# Patient Record
Sex: Female | Born: 1990 | Race: White | Hispanic: No | Marital: Married | Smoking: Never smoker
Health system: Southern US, Community
[De-identification: ages and names within clinical notes are randomized; demographics above are authoritative.]

## PROBLEM LIST (undated history)

## (undated) ENCOUNTER — Inpatient Hospital Stay (HOSPITAL_COMMUNITY): Payer: Self-pay

## (undated) DIAGNOSIS — B9681 Helicobacter pylori [H. pylori] as the cause of diseases classified elsewhere: Secondary | ICD-10-CM

## (undated) DIAGNOSIS — K279 Peptic ulcer, site unspecified, unspecified as acute or chronic, without hemorrhage or perforation: Secondary | ICD-10-CM

## (undated) DIAGNOSIS — N83209 Unspecified ovarian cyst, unspecified side: Secondary | ICD-10-CM

## (undated) DIAGNOSIS — E282 Polycystic ovarian syndrome: Secondary | ICD-10-CM

## (undated) HISTORY — PX: NO PAST SURGERIES: SHX2092

## (undated) HISTORY — DX: Helicobacter pylori (H. pylori) as the cause of diseases classified elsewhere: K27.9

## (undated) HISTORY — PX: UPPER GI ENDOSCOPY: SHX6162

## (undated) HISTORY — DX: Helicobacter pylori (H. pylori) as the cause of diseases classified elsewhere: B96.81

---

## 2016-07-31 ENCOUNTER — Other Ambulatory Visit (HOSPITAL_COMMUNITY)
Admission: RE | Admit: 2016-07-31 | Discharge: 2016-07-31 | Disposition: A | Discharge status: Home / Self Care | Payer: 59 | Source: Ambulatory Visit | Attending: Obstetrics & Gynecology | Admitting: Obstetrics & Gynecology

## 2016-07-31 ENCOUNTER — Other Ambulatory Visit: Payer: Self-pay | Admitting: Obstetrics & Gynecology

## 2016-07-31 DIAGNOSIS — Z01419 Encounter for gynecological examination (general) (routine) without abnormal findings: Secondary | ICD-10-CM | POA: Insufficient documentation

## 2016-08-02 LAB — CYTOLOGY - PAP: Diagnosis: NEGATIVE

## 2016-09-07 ENCOUNTER — Other Ambulatory Visit: Payer: Self-pay | Admitting: Gastroenterology

## 2016-09-07 DIAGNOSIS — R1033 Periumbilical pain: Secondary | ICD-10-CM

## 2016-09-07 DIAGNOSIS — R110 Nausea: Secondary | ICD-10-CM

## 2016-09-18 ENCOUNTER — Ambulatory Visit (HOSPITAL_COMMUNITY)
Admission: RE | Admit: 2016-09-18 | Discharge: 2016-09-18 | Disposition: A | Discharge status: Home / Self Care | Payer: 59 | Source: Ambulatory Visit | Attending: Gastroenterology | Admitting: Gastroenterology

## 2016-09-18 ENCOUNTER — Encounter (HOSPITAL_COMMUNITY): Payer: Self-pay | Admitting: Radiology

## 2016-09-18 DIAGNOSIS — R1033 Periumbilical pain: Secondary | ICD-10-CM

## 2016-09-18 DIAGNOSIS — R110 Nausea: Secondary | ICD-10-CM | POA: Diagnosis present

## 2016-09-18 MED ORDER — TECHNETIUM TC 99M MEBROFENIN IV KIT
4.9400 | PACK | Freq: Once | INTRAVENOUS | Status: AC | PRN
Start: 2016-09-18 — End: 2016-09-18
  Administered 2016-09-18: 4.94 via INTRAVENOUS

## 2017-10-09 NOTE — L&D Delivery Note (Signed)
Delivery Note At 2:51 PM a viable and healthy female was delivered via Vaginal, Spontaneous (Presentation: LOA  ).  APGAR: 8, 9; weight  pending.   Placenta status: spontaneous, intact.  Cord:  with the following complications: none.  Cord pH: na  Anesthesia:  epidural Episiotomy:  none Lacerations: 3rd degree- 100% int/anal sphincter Suture Repair: 2.0 vicryl rapide and 0 vicryl Est. Blood Loss (mL):  327  Mom to postpartum.  Baby to Couplet care / Skin to Skin.  Redmond Whittley J 07/08/2018, 3:12 PM

## 2017-11-26 ENCOUNTER — Other Ambulatory Visit: Payer: Self-pay

## 2017-11-26 ENCOUNTER — Encounter (HOSPITAL_COMMUNITY): Payer: Self-pay | Admitting: Anesthesiology

## 2017-11-26 ENCOUNTER — Encounter (HOSPITAL_COMMUNITY): Payer: Self-pay | Admitting: *Deleted

## 2017-11-26 ENCOUNTER — Inpatient Hospital Stay (HOSPITAL_COMMUNITY)
Admission: AD | Admit: 2017-11-26 | Discharge: 2017-11-26 | Disposition: A | Discharge status: Home / Self Care | Payer: Commercial Managed Care - PPO | Source: Ambulatory Visit | Attending: Obstetrics & Gynecology | Admitting: Obstetrics & Gynecology

## 2017-11-26 DIAGNOSIS — R1110 Vomiting, unspecified: Secondary | ICD-10-CM | POA: Diagnosis present

## 2017-11-26 DIAGNOSIS — Z3A01 Less than 8 weeks gestation of pregnancy: Secondary | ICD-10-CM | POA: Diagnosis not present

## 2017-11-26 DIAGNOSIS — A084 Viral intestinal infection, unspecified: Secondary | ICD-10-CM | POA: Diagnosis not present

## 2017-11-26 DIAGNOSIS — E282 Polycystic ovarian syndrome: Secondary | ICD-10-CM | POA: Diagnosis not present

## 2017-11-26 DIAGNOSIS — O99611 Diseases of the digestive system complicating pregnancy, first trimester: Secondary | ICD-10-CM | POA: Insufficient documentation

## 2017-11-26 DIAGNOSIS — O26891 Other specified pregnancy related conditions, first trimester: Secondary | ICD-10-CM | POA: Diagnosis not present

## 2017-11-26 DIAGNOSIS — O99281 Endocrine, nutritional and metabolic diseases complicating pregnancy, first trimester: Secondary | ICD-10-CM | POA: Diagnosis not present

## 2017-11-26 DIAGNOSIS — R109 Unspecified abdominal pain: Secondary | ICD-10-CM | POA: Diagnosis not present

## 2017-11-26 HISTORY — DX: Unspecified ovarian cyst, unspecified side: N83.209

## 2017-11-26 HISTORY — DX: Polycystic ovarian syndrome: E28.2

## 2017-11-26 LAB — CBC
HCT: 36.5 % (ref 36.0–46.0)
Hemoglobin: 12.8 g/dL (ref 12.0–15.0)
MCH: 30.5 pg (ref 26.0–34.0)
MCHC: 35.1 g/dL (ref 30.0–36.0)
MCV: 87.1 fL (ref 78.0–100.0)
PLATELETS: 257 10*3/uL (ref 150–400)
RBC: 4.19 MIL/uL (ref 3.87–5.11)
RDW: 11.7 % (ref 11.5–15.5)
WBC: 10.3 10*3/uL (ref 4.0–10.5)

## 2017-11-26 LAB — COMPREHENSIVE METABOLIC PANEL
ALBUMIN: 4.5 g/dL (ref 3.5–5.0)
ALT: 15 U/L (ref 14–54)
ANION GAP: 8 (ref 5–15)
AST: 20 U/L (ref 15–41)
Alkaline Phosphatase: 43 U/L (ref 38–126)
BILIRUBIN TOTAL: 1 mg/dL (ref 0.3–1.2)
BUN: 7 mg/dL (ref 6–20)
CO2: 22 mmol/L (ref 22–32)
Calcium: 9.2 mg/dL (ref 8.9–10.3)
Chloride: 103 mmol/L (ref 101–111)
Creatinine, Ser: 0.55 mg/dL (ref 0.44–1.00)
GFR calc Af Amer: >60 mL/min (ref >60)
GFR calc non Af Amer: >60 mL/min (ref >60)
GLUCOSE: 98 mg/dL (ref 65–99)
POTASSIUM: 4 mmol/L (ref 3.5–5.1)
SODIUM: 133 mmol/L — AB (ref 135–145)
TOTAL PROTEIN: 7.6 g/dL (ref 6.5–8.1)

## 2017-11-26 LAB — URINALYSIS, ROUTINE W REFLEX MICROSCOPIC
BILIRUBIN URINE: NEGATIVE
Glucose, UA: NEGATIVE mg/dL
HGB URINE DIPSTICK: NEGATIVE
Ketones, ur: NEGATIVE mg/dL
LEUKOCYTES UA: NEGATIVE
NITRITE: NEGATIVE
PH: 8 (ref 5.0–8.0)
Protein, ur: 30 mg/dL — AB
SPECIFIC GRAVITY, URINE: 1.019 (ref 1.005–1.030)

## 2017-11-26 LAB — POCT PREGNANCY, URINE: PREG TEST UR: POSITIVE — AB

## 2017-11-26 MED ORDER — M.V.I. ADULT IV INJ
Freq: Once | INTRAVENOUS | Status: AC
  Administered 2017-11-26: 12:00:00 via INTRAVENOUS
  Filled 2017-11-26: qty 10

## 2017-11-26 MED ORDER — PROMETHAZINE HCL 25 MG/ML IJ SOLN
25.0000 mg | Freq: Once | INTRAMUSCULAR | Status: AC
  Administered 2017-11-26: 25 mg via INTRAVENOUS
  Filled 2017-11-26: qty 1

## 2017-11-26 MED ORDER — ONDANSETRON 8 MG PO TBDP: 8.0000 mg | ORAL_TABLET | Freq: Three times a day (TID) | ORAL | 0 refills | Status: DC | PRN

## 2017-11-26 MED ORDER — LACTATED RINGERS IV BOLUS (SEPSIS)
1000.0000 mL | Freq: Once | INTRAVENOUS | Status: AC
  Administered 2017-11-26: 1000 mL via INTRAVENOUS

## 2017-11-26 MED ORDER — SODIUM CHLORIDE 0.9 % IV SOLN
8.0000 mg | Freq: Once | INTRAVENOUS | Status: AC
  Administered 2017-11-26: 8 mg via INTRAVENOUS
  Filled 2017-11-26: qty 4

## 2017-11-26 NOTE — MAU Note (Signed)
Ongoing vomiting, ? Dehydration.  Had US confirmation in office last wk.

## 2017-11-26 NOTE — Discharge Instructions (Signed)
Bland Diet A bland diet consists of foods that do not have a lot of fat or fiber. Foods without fat or fiber are easier for the body to digest. They are also less likely to irritate your mouth, throat, stomach, and other parts of your gastrointestinal tract. A bland diet is sometimes called a BRAT diet. What is my plan? Your health care provider or dietitian may recommend specific changes to your diet to prevent and treat your symptoms, such as:  Eating small meals often.  Cooking food until it is soft enough to chew easily.  Chewing your food well.  Drinking fluids slowly.  Not eating foods that are very spicy, sour, or fatty.  Not eating citrus fruits, such as oranges and grapefruit.  What do I need to know about this diet?  Eat a variety of foods from the bland diet food list.  Do not follow a bland diet longer than you have to.  Ask your health care provider whether you should take vitamins. What foods can I eat? Grains  Hot cereals, such as cream of wheat. Bread, crackers, or tortillas made from refined white flour. Rice. Vegetables Canned or cooked vegetables. Mashed or boiled potatoes. Fruits Bananas. Applesauce. Other types of cooked or canned fruit with the skin and seeds removed, such as canned peaches or pears. Meats and Other Protein Sources Scrambled eggs. Creamy peanut butter or other nut butters. Lean, well-cooked meats, such as chicken or fish. Tofu. Soups or broths. Dairy Low-fat dairy products, such as milk, cottage cheese, or yogurt. Beverages Water. Herbal tea. Apple juice. Sweets and Desserts Pudding. Custard. Fruit gelatin. Ice cream. Fats and Oils Mild salad dressings. Canola or olive oil. The items listed above may not be a complete list of allowed foods or beverages. Contact your dietitian for more options. What foods are not recommended? Foods and ingredients that are often not recommended include:  Spicy foods, such as hot sauce or  salsa.  Fried foods.  Sour foods, such as pickled or fermented foods.  Raw vegetables or fruits, especially citrus or berries.  Caffeinated drinks.  Alcohol.  Strongly flavored seasonings or condiments.  The items listed above may not be a complete list of foods and beverages that are not allowed. Contact your dietitian for more information. This information is not intended to replace advice given to you by your health care provider. Make sure you discuss any questions you have with your health care provider. Document Released: 01/17/2016 Document Revised: 03/02/2016 Document Reviewed: 10/07/2014 Elsevier Interactive Patient Education  2018 Elsevier Inc. Viral Gastroenteritis, Adult Viral gastroenteritis is also known as the stomach flu. This condition is caused by certain germs (viruses). These germs can be passed from person to person very easily (are very contagious). This condition can cause sudden watery poop (diarrhea), fever, and throwing up (vomiting). Having watery poop and throwing up can make you feel weak and cause you to get dehydrated. Dehydration can make you tired and thirsty, make you have a dry mouth, and make it so you pee (urinate) less often. Older adults and people with other diseases or a weak defense system (immune system) are at higher risk for dehydration. It is important to replace the fluids that you lose from having watery poop and throwing up. Follow these instructions at home: Follow instructions from your doctor about how to care for yourself at home. Eating and drinking  Follow these instructions as told by your doctor:  Take an oral rehydration solution (ORS). This is   a drink that is sold at pharmacies and stores.  Drink clear fluids in small amounts as you are able, such as: ? Water. ? Ice chips. ? Diluted fruit juice. ? Low-calorie sports drinks.  Eat bland, easy-to-digest foods in small amounts as you are able, such  as: ? Bananas. ? Applesauce. ? Rice. ? Low-fat (lean) meats. ? Toast. ? Crackers.  Avoid fluids that have a lot of sugar or caffeine in them.  Avoid alcohol.  Avoid spicy or fatty foods.  General instructions  Drink enough fluid to keep your pee (urine) clear or pale yellow.  Wash your hands often. If you cannot use soap and water, use hand sanitizer.  Make sure that all people in your home wash their hands well and often.  Rest at home while you get better.  Take over-the-counter and prescription medicines only as told by your doctor.  Watch your condition for any changes.  Take a warm bath to help with any burning or pain from having watery poop.  Keep all follow-up visits as told by your doctor. This is important. Contact a doctor if:  You cannot keep fluids down.  Your symptoms get worse.  You have new symptoms.  You feel light-headed or dizzy.  You have muscle cramps. Get help right away if:  You have chest pain.  You feel very weak or you pass out (faint).  You see blood in your throw-up.  Your throw-up looks like coffee grounds.  You have bloody or black poop (stools) or poop that look like tar.  You have a very bad headache, a stiff neck, or both.  You have a rash.  You have very bad pain, cramping, or bloating in your belly (abdomen).  You have trouble breathing.  You are breathing very quickly.  Your heart is beating very quickly.  Your skin feels cold and clammy.  You feel confused.  You have pain when you pee.  You have signs of dehydration, such as: ? Dark pee, hardly any pee, or no pee. ? Cracked lips. ? Dry mouth. ? Sunken eyes. ? Sleepiness. ? Weakness. This information is not intended to replace advice given to you by your health care provider. Make sure you discuss any questions you have with your health care provider. Document Released: 03/13/2008 Document Revised: 04/14/2016 Document Reviewed: 06/01/2015 Elsevier  Interactive Patient Education  2017 Elsevier Inc.  

## 2017-11-26 NOTE — MAU Note (Signed)
CRNA called to start IV 

## 2017-11-26 NOTE — MAU Provider Note (Signed)
History     CSN: [id number]  Arrival date and time: 11/26/17 [id number]   First Provider Initiated Contact with Patient 11/26/17 0901      Chief Complaint  Patient presents with  . Emesis  . dehydrated  . Abdominal Pain   G1 @[redacted]w[redacted]d  here with N/V. Sx started 2 days ago. She is unable to tolerate anything po. No sick contacts. Associated sx are diarrhea x2 episodes. No fevers. No VB. Some abdominal cramping. Pt seen in office last week and had confirmed IUP on US.    OB History    Gravida Para Term Preterm AB Living   1             SAB TAB Ectopic Multiple Live Births                  Past Medical History:  Diagnosis Date  . Ovarian cyst   . PCOS (polycystic ovarian syndrome)     Past Surgical History:  Procedure Laterality Date  . NO PAST SURGERIES    . UPPER GI ENDOSCOPY      Family History  Problem Relation Age of Onset  . Heart disease Maternal Grandmother   . Stroke Paternal Grandfather   . Heart disease Paternal Grandfather     Social History   Tobacco Use  . Smoking status: Never Smoker  . Smokeless tobacco: Never Used  Substance Use Topics  . Alcohol use: Yes    Comment: not with preg  . Drug use: No    Allergies: No Known Allergies  Medications Prior to Admission  Medication Sig Dispense Refill Last Dose  . doxylamine, Sleep, (UNISOM) 25 MG tablet Take 25 mg by mouth at bedtime as needed.   11/25/2017 at Unknown time  . Prenatal Vit-Fe Fumarate-FA (PRENATAL MULTIVITAMIN) TABS tablet Take 1 tablet by mouth daily at 12 noon.   11/25/2017 at Unknown time  . pyridOXINE (VITAMIN B-6) 25 MG tablet Take 25 mg by mouth daily.   11/25/2017 at Unknown time    Review of Systems  Constitutional: Negative for fever.  Gastrointestinal: Positive for abdominal pain, diarrhea, nausea and vomiting. Negative for constipation.  Genitourinary: Negative for vaginal bleeding.   Physical Exam   Blood pressure (!) 118/56, pulse 74, temperature 98.5 F (36.9 C),  temperature source Oral, resp. rate 18, height 5' 3.39" (1.61 m), weight 141 lb 1.9 oz (64 kg), SpO2 100 %.  Physical Exam  Nursing note and vitals reviewed. Constitutional: She is oriented to person, place, and time. She appears well-developed and well-nourished. No distress.  HENT:  Head: Normocephalic.  Neck: Normal range of motion.  Respiratory: Effort normal. No respiratory distress.  Musculoskeletal: Normal range of motion.  Neurological: She is alert and oriented to person, place, and time.  Skin: Skin is warm and dry.  Psychiatric: She has a normal mood and affect.   Results for orders placed or performed during the hospital encounter of 11/26/17 (from the past 24 hour(s))  Urinalysis, Routine w reflex microscopic     Status: Abnormal   Collection Time: 11/26/17  8:27 AM  Result Value Ref Range   Color, Urine YELLOW YELLOW   APPearance HAZY (A) CLEAR   Specific Gravity, Urine 1.019 1.005 - 1.030   pH 8.0 5.0 - 8.0   Glucose, UA NEGATIVE NEGATIVE mg/dL   Hgb urine dipstick NEGATIVE NEGATIVE   Bilirubin Urine NEGATIVE NEGATIVE   Ketones, ur NEGATIVE NEGATIVE mg/dL   Protein, ur 30 (A) NEGATIVE mg/dL  Nitrite NEGATIVE NEGATIVE   Leukocytes, UA NEGATIVE NEGATIVE   RBC / HPF 0-5 0 - 5 RBC/hpf   WBC, UA 0-5 0 - 5 WBC/hpf   Bacteria, UA RARE (A) NONE SEEN   Squamous Epithelial / LPF 6-30 (A) NONE SEEN   Mucus PRESENT   Pregnancy, urine POC     Status: Abnormal   Collection Time: 11/26/17  8:37 AM  Result Value Ref Range   Preg Test, Ur POSITIVE (A) NEGATIVE  CBC     Status: None   Collection Time: 11/26/17  9:17 AM  Result Value Ref Range   WBC 10.3 4.0 - 10.5 K/uL   RBC 4.19 3.87 - 5.11 MIL/uL   Hemoglobin 12.8 12.0 - 15.0 g/dL   HCT 91.4 78.2 - 95.6 %   MCV 87.1 78.0 - 100.0 fL   MCH 30.5 26.0 - 34.0 pg   MCHC 35.1 30.0 - 36.0 g/dL   RDW 21.3 08.6 - 57.8 %   Platelets 257 150 - 400 K/uL  Comprehensive metabolic panel     Status: Abnormal   Collection Time:  11/26/17  9:17 AM  Result Value Ref Range   Sodium 133 (L) 135 - 145 mmol/L   Potassium 4.0 3.5 - 5.1 mmol/L   Chloride 103 101 - 111 mmol/L   CO2 22 22 - 32 mmol/L   Glucose, Bld 98 65 - 99 mg/dL   BUN 7 6 - 20 mg/dL   Creatinine, Ser 4.69 0.44 - 1.00 mg/dL   Calcium 9.2 8.9 - 62.9 mg/dL   Total Protein 7.6 6.5 - 8.1 g/dL   Albumin 4.5 3.5 - 5.0 g/dL   AST 20 15 - 41 U/L   ALT 15 14 - 54 U/L   Alkaline Phosphatase 43 38 - 126 U/L   Total Bilirubin 1.0 0.3 - 1.2 mg/dL   GFR calc non Af Amer >60 >60 mL/min   GFR calc Af Amer >60 >60 mL/min   Anion gap 8 5 - 15   Limited bedside US: viable, active fetus, +cardiac activity, subj. nml AFV  MAU Course  Procedures LR 1 L Phenergan MTV 1 L Zofran  MDM Labs ordered and reviewed. Had some emesis after Phenergan. Zofran ordered. Pt reports nausea improved, no further emesis, tolerating po fluids. Presentation, clinical findings, and plan discussed with Dr. Richardson Dopp. Stable for discharge home.  Assessment and Plan   1. [redacted] weeks gestation of pregnancy   2. Viral gastroenteritis    Discharge home Follow up in OB office in 2 weeks Rx Zofran ODT BRAT diet Return for worsening sx  Allergies as of 11/26/2017   No Known Allergies     Medication List    TAKE these medications   doxylamine (Sleep) 25 MG tablet Commonly known as:  UNISOM Take 25 mg by mouth at bedtime as needed.   ondansetron 8 MG disintegrating tablet Commonly known as:  ZOFRAN ODT Take 1 tablet (8 mg total) by mouth every 8 (eight) hours as needed for nausea or vomiting.   prenatal multivitamin Tabs tablet Take 1 tablet by mouth daily at 12 noon.   pyridOXINE 25 MG tablet Commonly known as:  VITAMIN B-6 Take 25 mg by mouth daily.      Donette Larry, CNM 11/26/2017, 2:56 PM

## 2017-11-26 NOTE — MAU Note (Signed)
RN to the BS to start IV, unsuccessful x2.  Another RN called to the BS to start IV.

## 2017-11-26 NOTE — MAU Note (Signed)
Ongoing vomiting, taking unisom and B6, "not touching it"

## 2017-12-17 ENCOUNTER — Inpatient Hospital Stay (HOSPITAL_COMMUNITY)
Admission: AD | Admit: 2017-12-17 | Discharge: 2017-12-17 | Disposition: A | Discharge status: Home / Self Care | Payer: Commercial Managed Care - PPO | Source: Ambulatory Visit | Attending: Obstetrics & Gynecology | Admitting: Obstetrics & Gynecology

## 2017-12-17 ENCOUNTER — Encounter (HOSPITAL_COMMUNITY): Payer: Self-pay | Admitting: *Deleted

## 2017-12-17 DIAGNOSIS — E282 Polycystic ovarian syndrome: Secondary | ICD-10-CM | POA: Diagnosis not present

## 2017-12-17 DIAGNOSIS — Z3A10 10 weeks gestation of pregnancy: Secondary | ICD-10-CM | POA: Diagnosis not present

## 2017-12-17 DIAGNOSIS — R112 Nausea with vomiting, unspecified: Secondary | ICD-10-CM | POA: Diagnosis present

## 2017-12-17 DIAGNOSIS — O3481 Maternal care for other abnormalities of pelvic organs, first trimester: Secondary | ICD-10-CM | POA: Insufficient documentation

## 2017-12-17 DIAGNOSIS — O219 Vomiting of pregnancy, unspecified: Secondary | ICD-10-CM | POA: Insufficient documentation

## 2017-12-17 LAB — URINALYSIS, ROUTINE W REFLEX MICROSCOPIC
Bilirubin Urine: NEGATIVE
Glucose, UA: NEGATIVE mg/dL
Hgb urine dipstick: NEGATIVE
KETONES UR: 80 mg/dL — AB
LEUKOCYTES UA: NEGATIVE
Nitrite: NEGATIVE
PH: 6 (ref 5.0–8.0)
Protein, ur: NEGATIVE mg/dL
Specific Gravity, Urine: 1.025 (ref 1.005–1.030)

## 2017-12-17 MED ORDER — FAMOTIDINE IN NACL 20-0.9 MG/50ML-% IV SOLN
20.0000 mg | Freq: Once | INTRAVENOUS | Status: AC
  Administered 2017-12-17: 20 mg via INTRAVENOUS
  Filled 2017-12-17: qty 50

## 2017-12-17 MED ORDER — METOCLOPRAMIDE HCL 10 MG PO TABS: 10.0000 mg | ORAL_TABLET | Freq: Three times a day (TID) | ORAL | 1 refills | Status: DC

## 2017-12-17 MED ORDER — SODIUM CHLORIDE 0.9 % IV SOLN
8.0000 mg | Freq: Once | INTRAVENOUS | Status: AC
  Administered 2017-12-17: 8 mg via INTRAVENOUS
  Filled 2017-12-17: qty 4

## 2017-12-17 MED ORDER — METOCLOPRAMIDE HCL 5 MG/ML IJ SOLN
10.0000 mg | Freq: Once | INTRAMUSCULAR | Status: AC
  Administered 2017-12-17: 10 mg via INTRAVENOUS
  Filled 2017-12-17: qty 2

## 2017-12-17 MED ORDER — LACTATED RINGERS IV BOLUS (SEPSIS)
1000.0000 mL | Freq: Once | INTRAVENOUS | Status: AC
  Administered 2017-12-17: 1000 mL via INTRAVENOUS

## 2017-12-17 MED ORDER — DEXTROSE 5 % IN LACTATED RINGERS IV BOLUS
1000.0000 mL | Freq: Once | INTRAVENOUS | Status: AC
  Administered 2017-12-17: 1000 mL via INTRAVENOUS

## 2017-12-17 NOTE — Discharge Instructions (Signed)
Nausea and Vomiting, Adult Feeling sick to your stomach (nausea) means that your stomach is upset or you feel like you have to throw up (vomit). Feeling more and more sick to your stomach can lead to throwing up. Throwing up happens when food and liquid from your stomach are thrown up and out the mouth. Throwing up can make you feel weak and cause you to get dehydrated. Dehydration can make you tired and thirsty, make you have a dry mouth, and make it so you pee (urinate) less often. Older adults and people with other diseases or a weak defense system (immune system) are at higher risk for dehydration. If you feel sick to your stomach or if you throw up, it is important to follow instructions from your doctor about how to take care of yourself. Follow these instructions at home: Eating and drinking Follow these instructions as told by your doctor:  Take an oral rehydration solution (ORS). This is a drink that is sold at pharmacies and stores.  Drink clear fluids in small amounts as you are able, such as: ? Water. ? Ice chips. ? Diluted fruit juice. ? Low-calorie sports drinks.  Eat bland, easy-to-digest foods in small amounts as you are able, such as: ? Bananas. ? Applesauce. ? Rice. ? Low-fat (lean) meats. ? Toast. ? Crackers.  Avoid fluids that have a lot of sugar or caffeine in them.  Avoid alcohol.  Avoid spicy or fatty foods.  General instructions  Drink enough fluid to keep your pee (urine) clear or pale yellow.  Wash your hands often. If you cannot use soap and water, use hand sanitizer.  Make sure that all people in your home wash their hands well and often.  Take over-the-counter and prescription medicines only as told by your doctor.  Rest at home while you get better.  Watch your condition for any changes.  Breathe slowly and deeply when you feel sick to your stomach.  Keep all follow-up visits as told by your doctor. This is important. Contact a doctor  if:  You have a fever.  You cannot keep fluids down.  Your symptoms get worse.  You have new symptoms.  You feel sick to your stomach for more than two days.  You feel light-headed or dizzy.  You have a headache.  You have muscle cramps. Get help right away if:  You have pain in your chest, neck, arm, or jaw.  You feel very weak or you pass out (faint).  You throw up again and again.  You see blood in your throw-up.  Your throw-up looks like black coffee grounds.  You have bloody or black poop (stools) or poop that look like tar.  You have a very bad headache, a stiff neck, or both.  You have a rash.  You have very bad pain, cramping, or bloating in your belly (abdomen).  You have trouble breathing.  You are breathing very quickly.  Your heart is beating very quickly.  Your skin feels cold and clammy.  You feel confused.  You have pain when you pee.  You have signs of dehydration, such as: ? Dark pee, hardly any pee, or no pee. ? Cracked lips. ? Dry mouth. ? Sunken eyes. ? Sleepiness. ? Weakness. These symptoms may be an emergency. Do not wait to see if the symptoms will go away. Get medical help right away. Call your local emergency services (911 in the [place] Do not drive yourself to the hospital. This information is   not intended to replace advice given to you by your health care provider. Make sure you discuss any questions you have with your health care provider. Document Released: 03/13/2008 Document Revised: 04/14/2016 Document Reviewed: 06/01/2015 Elsevier Interactive Patient Education  2018 Elsevier Inc.  

## 2017-12-17 NOTE — MAU Provider Note (Signed)
History     CSN: [id number]  Arrival date and time: 12/17/17 1626   First Provider Initiated Contact with Patient 12/17/17 1952      Chief Complaint  Patient presents with  . Emesis  . Nausea   HPI   [name] is a 27 y.o. female G1P0 @ [redacted]w[redacted]d here in MAU with nausea and vomiting. Says she has had N/V throughout the pregnancy and has been taking Zofran. Today she was not able to keep anything down. She feels dehydrated. Denies abdominal pain or bleeding.   OB History    Gravida Para Term Preterm AB Living   1             SAB TAB Ectopic Multiple Live Births                  Past Medical History:  Diagnosis Date  . Ovarian cyst   . PCOS (polycystic ovarian syndrome)     Past Surgical History:  Procedure Laterality Date  . NO PAST SURGERIES    . UPPER GI ENDOSCOPY      Family History  Problem Relation Age of Onset  . Heart disease Maternal Grandmother   . Stroke Paternal Grandfather   . Heart disease Paternal Grandfather     Social History   Tobacco Use  . Smoking status: Never Smoker  . Smokeless tobacco: Never Used  Substance Use Topics  . Alcohol use: Yes    Comment: not with preg  . Drug use: No    Allergies: No Known Allergies  Medications Prior to Admission  Medication Sig Dispense Refill Last Dose  . doxylamine, Sleep, (UNISOM) 25 MG tablet Take 25 mg by mouth at bedtime as needed.   Past Week at Unknown time  . ondansetron (ZOFRAN ODT) 8 MG disintegrating tablet Take 1 tablet (8 mg total) by mouth every 8 (eight) hours as needed for nausea or vomiting. 20 tablet 0 12/17/2017 at 1400  . Prenatal Vit-Fe Fumarate-FA (PRENATAL MULTIVITAMIN) TABS tablet Take 1 tablet by mouth daily at 12 noon.   12/16/2017 at Unknown time  . ranitidine (ZANTAC) 300 MG tablet Take 300 mg by mouth at bedtime.   12/16/2017 at Unknown time  . pyridOXINE (VITAMIN B-6) 25 MG tablet Take 25 mg by mouth daily.   Unknown at Unknown time   Results for orders placed or  performed during the hospital encounter of 12/17/17 (from the past 48 hour(s))  Urinalysis, Routine w reflex microscopic     Status: Abnormal   Collection Time: 12/17/17  5:10 PM  Result Value Ref Range   Color, Urine YELLOW YELLOW   APPearance HAZY (A) CLEAR   Specific Gravity, Urine 1.025 1.005 - 1.030   pH 6.0 5.0 - 8.0   Glucose, UA NEGATIVE NEGATIVE mg/dL   Hgb urine dipstick NEGATIVE NEGATIVE   Bilirubin Urine NEGATIVE NEGATIVE   Ketones, ur 80 (A) NEGATIVE mg/dL   Protein, ur NEGATIVE NEGATIVE mg/dL   Nitrite NEGATIVE NEGATIVE   Leukocytes, UA NEGATIVE NEGATIVE    Comment: Performed at [facility], [address] [place], [place] [address]   Review of Systems  Constitutional: Negative for fever.  Gastrointestinal: Positive for nausea and vomiting. Negative for abdominal pain.  Genitourinary: Negative for vaginal bleeding and vaginal discharge.   Physical Exam   Blood pressure 116/68, pulse 67, temperature 98.9 F (37.2 C), temperature source Oral, resp. rate 16, height 5\' 3"  (1.6 m), weight 142 lb (64.4 kg), SpO2 100 %.  Physical  Exam  Constitutional: She is oriented to person, place, and time. She appears well-developed and well-nourished. No distress.  HENT:  Head: Normocephalic.  Respiratory: Effort normal.  Musculoskeletal: Normal range of motion.  Neurological: She is alert and oriented to person, place, and time.  Skin: Skin is warm. She is not diaphoretic.  Psychiatric: Her behavior is normal.   MAU Course  Procedures  None  MDM  + fetal heart tones via doppler  Discussed patient with Dr. Dion BodyVarnado; who was aware the patient was coming into MAU.  Urine shows >80 ketones  LR bolus X 1 D5LR bolus X 1 Pepcid 20 mg IV x1 Zofran 8 mg IV X 1 Reglan 10 mg IV X 1 Patient sipping on apple juice at this time.   Assessment and Plan   A:  1. Nausea and vomiting in pregnancy     P:  Discharge home with strict return precautions Follow up with OB as  scheduled Rx: Reglan Continue medications at home as directed  Small, frequent, bland meals  Chase Cassetta, Harolyn RutherfordJennifer I, NP 12/17/2017 8:46 PM

## 2017-12-17 NOTE — MAU Note (Signed)
Pt reports continued nausea and vomiting with pregnancy. Unable to keep anything down for the last few days.

## 2018-01-10 ENCOUNTER — Inpatient Hospital Stay (HOSPITAL_COMMUNITY)
Admission: AD | Admit: 2018-01-10 | Discharge: 2018-01-10 | Disposition: A | Discharge status: Home / Self Care | Payer: Commercial Managed Care - PPO | Source: Ambulatory Visit | Attending: Obstetrics & Gynecology | Admitting: Obstetrics & Gynecology

## 2018-01-10 ENCOUNTER — Encounter (HOSPITAL_COMMUNITY): Payer: Self-pay | Admitting: *Deleted

## 2018-01-10 DIAGNOSIS — O209 Hemorrhage in early pregnancy, unspecified: Secondary | ICD-10-CM | POA: Diagnosis not present

## 2018-01-10 DIAGNOSIS — Z3A14 14 weeks gestation of pregnancy: Secondary | ICD-10-CM | POA: Insufficient documentation

## 2018-01-10 DIAGNOSIS — O4692 Antepartum hemorrhage, unspecified, second trimester: Secondary | ICD-10-CM

## 2018-01-10 NOTE — MAU Provider Note (Signed)
Chief Complaint: Vaginal Bleeding   First Provider Initiated Contact with Patient 01/10/18 2156        SUBJECTIVE HPI: Molly Chase is a 27 y.o. G1P0 at 2561w0d by LMP who presents to maternity admissions reporting bleeding since 9pm   Was like a period at first, now less.  Minimal cramping.. She denies vaginal itching/burning, urinary symptoms, h/a, dizziness, n/v, or fever/chills.    Vaginal Bleeding  The patient's primary symptoms include pelvic pain and vaginal bleeding. The patient's pertinent negatives include no genital itching, genital lesions or genital odor. This is a new problem. The current episode started today. The problem occurs intermittently. The problem has been gradually improving. She is pregnant. Associated symptoms include nausea. Pertinent negatives include no back pain, chills, constipation, diarrhea, dysuria, fever or vomiting. The vaginal discharge was bloody. The vaginal bleeding is typical of menses. She has not been passing clots. She has not been passing tissue. Nothing aggravates the symptoms. She has tried nothing for the symptoms.   RN Note: PT SAYS VAG BLEEDING -  STARTED  AT 915PM.    HAD U/S  - ALL OK.  PNC  WITH  DR Molly NewtonZAN.   IN TRIAGE-   PUT PAD ON IN B-ROOM-   NOTHING.    LAST SEX- 2 WEEKS AGO .   SAYS 2 WEEKS AGO WAS ON PELVIC  REST AFTER AN EPISODE OF BLEEDING.     MILD  CRAMPS STARTED ON WAY TO HOSPITAL    Past Medical History:  Diagnosis Date  . Ovarian cyst   . PCOS (polycystic ovarian syndrome)    Past Surgical History:  Procedure Laterality Date  . NO PAST SURGERIES    . UPPER GI ENDOSCOPY     Social History   Socioeconomic History  . Marital status: Married    Spouse name: Not on file  . Number of children: Not on file  . Years of education: Not on file  . Highest education level: Not on file  Occupational History  . Not on file  Social Needs  . Financial resource strain: Not on file  . Food insecurity:    Worry: Not on file   Inability: Not on file  . Transportation needs:    Medical: Not on file    Non-medical: Not on file  Tobacco Use  . Smoking status: Never Smoker  . Smokeless tobacco: Never Used  Substance and Sexual Activity  . Alcohol use: Yes    Comment: not with preg  . Drug use: No  . Sexual activity: Not on file  Lifestyle  . Physical activity:    Days per week: Not on file    Minutes per session: Not on file  . Stress: Not on file  Relationships  . Social connections:    Talks on phone: Not on file    Gets together: Not on file    Attends religious service: Not on file    Active member of club or organization: Not on file    Attends meetings of clubs or organizations: Not on file    Relationship status: Not on file  . Intimate partner violence:    Fear of current or ex partner: Not on file    Emotionally abused: Not on file    Physically abused: Not on file    Forced sexual activity: Not on file  Other Topics Concern  . Not on file  Social History Narrative  . Not on file   No current facility-administered medications on  file prior to encounter.    Current Outpatient Medications on File Prior to Encounter  Medication Sig Dispense Refill  . doxylamine, Sleep, (UNISOM) 25 MG tablet Take 25 mg by mouth at bedtime as needed.    . metoCLOPramide (REGLAN) 10 MG tablet Take 1 tablet (10 mg total) by mouth 3 (three) times daily with meals for 180 doses. 90 tablet 1  . ondansetron (ZOFRAN ODT) 8 MG disintegrating tablet Take 1 tablet (8 mg total) by mouth every 8 (eight) hours as needed for nausea or vomiting. 20 tablet 0  . Prenatal Vit-Fe Fumarate-FA (PRENATAL MULTIVITAMIN) TABS tablet Take 1 tablet by mouth daily at 12 noon.    . pyridOXINE (VITAMIN B-6) 25 MG tablet Take 25 mg by mouth daily.    . ranitidine (ZANTAC) 300 MG tablet Take 300 mg by mouth at bedtime.     No Known Allergies  I have reviewed patient's Past Medical Hx, Surgical Hx, Family Hx, Social Hx, medications and  allergies.   ROS:  Review of Systems  Constitutional: Negative for chills and fever.  Gastrointestinal: Positive for nausea. Negative for constipation, diarrhea and vomiting.  Genitourinary: Positive for pelvic pain and vaginal bleeding. Negative for dysuria.  Musculoskeletal: Negative for back pain.   Review of Systems  Other systems negative   Physical Exam  Physical Exam Patient Vitals for the past 24 hrs:  BP Temp Temp src Pulse Resp Height Weight  01/10/18 2151 110/64 97.9 F (36.6 C) Oral 76 20 5\' 3"  (1.6 m) 145 lb 4 oz (65.9 kg)   Constitutional: Well-developed, well-nourished female in no acute distress.  Cardiovascular: normal rate Respiratory: normal effort GI: Abd soft, non-tender. Pos BS x 4 MS: Extremities nontender, no edema, normal ROM Neurologic: Alert and oriented x 4.  GU: Neg CVAT.  PELVIC EXAM: Cervix pink, visually closed, without lesion, scant light red mucous discharge, vaginal walls and external genitalia normal Bimanual exam: Cervix 0/long/high, firm, anterior, neg CMT, uterus nontender, nonenlarged, adnexa without tenderness, enlargement, or mass  FHT 160 by doppler  LAB RESULTS No results found for this or any previous visit (from the past 24 hour(s)).     IMAGING Patient distressed that Dr Molly Chase did not order an [place].  I did a brief bedside US to show her baby was well and had a good heartbeat. Fetus was active, normal in gross appearance.  Regular HR  MAU Management/MDM: Discussed bleeding in early pregnancy is not easily explained But since baby is alive, impact of bleeding is likely minimal Dr Molly Chase does recommend modified bedrest.  Pt is going to [facility]. Recommended as per Dr Molly Chase.  Also recommend pelvic rest  ASSESSMENT Single IUP at [redacted]w[redacted]d Bleeding in the second trimester  PLAN Discharge home Pelvic rest Modified bedrest Pt stable at time of discharge. Encouraged to return here or to other Urgent Care/ED if she develops  worsening of symptoms, increase in pain, fever, or other concerning symptoms.    Wynelle Bourgeois CNM, MSN Certified Nurse-Midwife 01/10/2018  9:56 PM

## 2018-01-10 NOTE — MAU Note (Signed)
Urine sent to lab 

## 2018-01-10 NOTE — Discharge Instructions (Signed)
Vaginal Bleeding During Pregnancy, Second Trimester °A small amount of bleeding (spotting) from the vagina is relatively common in pregnancy. It usually stops on its own. Various things can cause bleeding or spotting in pregnancy. Some bleeding may be related to the pregnancy, and some may not. Sometimes the bleeding is normal and is not a problem. However, bleeding can also be a sign of something serious. Be sure to tell your health care provider about any vaginal bleeding right away. °Some possible causes of vaginal bleeding during the second trimester include: °· Infection, inflammation, or growths on the cervix. °· The placenta may be partially or completely covering the opening of the cervix inside the uterus (placenta previa). °· The placenta may have separated from the uterus (abruption of the placenta). °· You may be having early (preterm) labor. °· The cervix may not be strong enough to keep a baby inside the uterus (cervical insufficiency). °· Tiny cysts may have developed in the uterus instead of pregnancy tissue (molar pregnancy). ° °Follow these instructions at home: °Watch your condition for any changes. The following actions may help to lessen any discomfort you are feeling: °· Follow your health care provider's instructions for limiting your activity. If your health care provider orders bed rest, you may need to stay in bed and only get up to use the bathroom. However, your health care provider may allow you to continue light activity. °· If needed, make plans for someone to help with your regular activities and responsibilities while you are on bed rest. °· Keep track of the number of pads you use each day, how often you change pads, and how soaked (saturated) they are. Write this down. °· Do not use tampons. Do not douche. °· Do not have sexual intercourse or orgasms until approved by your health care provider. °· If you pass any tissue from your vagina, save the tissue so you can show it to your  health care provider. °· Only take over-the-counter or prescription medicines as directed by your health care provider. °· Do not take aspirin because it can make you bleed. °· Do not exercise or perform any strenuous activities or heavy lifting without your health care provider's permission. °· Keep all follow-up appointments as directed by your health care provider. ° °Contact a health care provider if: °· You have any vaginal bleeding during any part of your pregnancy. °· You have cramps or labor pains. °· You have a fever, not controlled by medicine. °Get help right away if: °· You have severe cramps in your back or belly (abdomen). °· You have contractions. °· You have chills. °· You pass large clots or tissue from your vagina. °· Your bleeding increases. °· You feel light-headed or weak, or you have fainting episodes. °· You are leaking fluid or have a gush of fluid from your vagina. °This information is not intended to replace advice given to you by your health care provider. Make sure you discuss any questions you have with your health care provider. °Document Released: 07/05/2005 Document Revised: 03/02/2016 Document Reviewed: 06/02/2013 °Elsevier Interactive Patient Education © 2018 Elsevier Inc. ° ° ° ° °Pelvic Rest °Pelvic rest may be recommended if: °· Your placenta is partially or completely covering the opening of your cervix (placenta previa). °· There is bleeding between the wall of the uterus and the amniotic sac in the first trimester of pregnancy (subchorionic hemorrhage). °· You went into labor too early (preterm labor). ° °Based on your overall health and the   health of your baby, your health care provider will decide if pelvic rest is right for you. °How do I rest my pelvis? °For as long as told by your health care provider: °· Do not have sex, sexual stimulation, or an orgasm. °· Do not use tampons. Do not douche. Do not put anything in your vagina. °· Do not lift anything that is heavier than  10 lb (4.5 kg). °· Avoid activities that take a lot of effort (are strenuous). °· Avoid any activity in which your pelvic muscles could become strained. ° °When should I seek medical care? °Seek medical care if you have: °· Cramping pain in your lower abdomen. °· Vaginal discharge. °· A low, dull backache. °· Regular contractions. °· Uterine tightening. ° °When should I seek immediate medical care? °Seek immediate medical care if: °· You have vaginal bleeding and you are pregnant. ° °This information is not intended to replace advice given to you by your health care provider. Make sure you discuss any questions you have with your health care provider. °Document Released: 01/20/2011 Document Revised: 03/02/2016 Document Reviewed: 03/29/2015 °Elsevier Interactive Patient Education © 2018 Elsevier Inc. ° °

## 2018-01-10 NOTE — MAU Note (Signed)
PT SAYS VAG BLEEDING -  STARTED  AT 915PM.    HAD U/S  - ALL OK.  PNC  WITH  DR Charlotta NewtonZAN.   IN TRIAGE-   PUT PAD ON IN B-ROOM-   NOTHING.    LAST SEX- 2 WEEKS AGO .   SAYS 2 WEEKS AGO WAS ON PELVIC  REST AFTER AN EPISODE OF BLEEDING.     MILD  CRAMPS STARTED ON WAY TO HOSPITAL

## 2018-03-06 ENCOUNTER — Other Ambulatory Visit: Payer: Self-pay

## 2018-03-06 ENCOUNTER — Inpatient Hospital Stay (HOSPITAL_COMMUNITY)
Admission: AD | Admit: 2018-03-06 | Discharge: 2018-03-06 | Disposition: A | Discharge status: Home / Self Care | Payer: Commercial Managed Care - PPO | Source: Ambulatory Visit | Attending: Obstetrics & Gynecology | Admitting: Obstetrics & Gynecology

## 2018-03-06 ENCOUNTER — Encounter (HOSPITAL_COMMUNITY): Payer: Self-pay | Admitting: *Deleted

## 2018-03-06 DIAGNOSIS — O26892 Other specified pregnancy related conditions, second trimester: Secondary | ICD-10-CM | POA: Diagnosis not present

## 2018-03-06 DIAGNOSIS — Z711 Person with feared health complaint in whom no diagnosis is made: Secondary | ICD-10-CM

## 2018-03-06 DIAGNOSIS — Z79899 Other long term (current) drug therapy: Secondary | ICD-10-CM | POA: Diagnosis not present

## 2018-03-06 DIAGNOSIS — O99282 Endocrine, nutritional and metabolic diseases complicating pregnancy, second trimester: Secondary | ICD-10-CM | POA: Insufficient documentation

## 2018-03-06 DIAGNOSIS — Z3A21 21 weeks gestation of pregnancy: Secondary | ICD-10-CM | POA: Diagnosis not present

## 2018-03-06 DIAGNOSIS — E282 Polycystic ovarian syndrome: Secondary | ICD-10-CM | POA: Diagnosis not present

## 2018-03-06 DIAGNOSIS — R109 Unspecified abdominal pain: Secondary | ICD-10-CM | POA: Diagnosis not present

## 2018-03-06 LAB — URINALYSIS, ROUTINE W REFLEX MICROSCOPIC
Bilirubin Urine: NEGATIVE
GLUCOSE, UA: 150 mg/dL — AB
Hgb urine dipstick: NEGATIVE
KETONES UR: NEGATIVE mg/dL
Leukocytes, UA: NEGATIVE
NITRITE: NEGATIVE
PROTEIN: NEGATIVE mg/dL
Specific Gravity, Urine: 1.006 (ref 1.005–1.030)
pH: 7 (ref 5.0–8.0)

## 2018-03-06 NOTE — MAU Note (Signed)
Having pain in lower abd and back, tightens - wraps around from front to back.  Getting pretty frequent.  Dr Charlotta Newton wanted her to come get checked. No bleeding or leaking. Waves of nausea with the pain

## 2018-03-06 NOTE — Discharge Instructions (Signed)
Abdominal Pain During Pregnancy Abdominal pain is common in pregnancy. Most of the time, it does not cause harm. There are many causes of abdominal pain. Some causes are more serious than others and sometimes the cause is not known. Abdominal pain can be a sign that something is very wrong with the pregnancy or the pain may have nothing to do with the pregnancy. Always tell your health care provider if you have any abdominal pain. Follow these instructions at home:  Do not have sex or put anything in your vagina until your symptoms go away completely.  Watch your abdominal pain for any changes.  Get plenty of rest until your pain improves.  Drink enough fluid to keep your urine clear or pale yellow.  Take over-the-counter or prescription medicines only as told by your health care provider.  Keep all follow-up visits as told by your health care provider. This is important. Contact a health care provider if:  You have a fever.  Your pain gets worse or you have cramping.  Your pain continues after resting. Get help right away if:  You are bleeding, leaking fluid, or passing tissue from the vagina.  You have vomiting or diarrhea that does not go away.  You have painful or bloody urination.  You notice a decrease in your baby's movements.  You feel very weak or faint.  You have shortness of breath.  You develop a severe headache with abdominal pain.  You have abnormal vaginal discharge with abdominal pain. This information is not intended to replace advice given to you by your health care provider. Make sure you discuss any questions you have with your health care provider. Document Released: 09/25/2005 Document Revised: 07/06/2016 Document Reviewed: 04/24/2013 Elsevier Interactive Patient Education  2018 Elsevier Inc.   Round Ligament Pain During Pregnancy   Round ligament pain is a sharp pain or jabbing feeling often felt in the lower belly or groin area on one or both  sides. It is one of the most common complaints during pregnancy and is considered a normal part of pregnancy. It is most often felt during the second trimester.   Here is what you need to know about round ligament pain, including some tips to help you feel better.   Causes of Round Ligament Pain   Several thick ligaments surround and support your womb (uterus) as it grows during pregnancy. One of them is called the round ligament.   The round ligament connects the front part of the womb to your groin, the area where your legs attach to your pelvis. The round ligament normally tightens and relaxes slowly.   As your baby and womb grow, the round ligament stretches. That makes it more likely to become strained.   Sudden movements can cause the ligament to tighten quickly, like a rubber band snapping. This causes a sudden and quick jabbing feeling.   Symptoms of Round Ligament Pain   Round ligament pain can be concerning and uncomfortable. But it is considered normal as your body changes during pregnancy.   The symptoms of round ligament pain include a sharp, sudden spasm in the belly. It usually affects the right side, but it may happen on both sides. The pain only lasts a few seconds.   Exercise may cause the pain, as will rapid movements such as:  sneezing  coughing  laughing  rolling over in bed  standing up too quickly   Treatment of Round Ligament Pain   Here are some tips that  may help reduce your discomfort:   Pain relief. Take over-the-counter acetaminophen for pain, if necessary. Ask your doctor if this is OK.   Exercise. Get plenty of exercise to keep your stomach (core) muscles strong. Doing stretching exercises or prenatal yoga can be helpful. Ask your doctor which exercises are safe for you and your baby.   A helpful exercise involves putting your hands and knees on the floor, lowering your head, and pushing your backside into the air.   Avoid sudden movements. Change  positions slowly (such as standing up or sitting down) to avoid sudden movements that may cause stretching and pain.   Flex your hips. Bend and flex your hips before you cough, sneeze, or laugh to avoid pulling on the ligaments.   Apply warmth. A heating pad or warm bath may be helpful. Ask your doctor if this is OK. Extreme heat can be dangerous to the baby.   You should try to modify your daily activity level and avoid positions that may worsen the condition.   When to Call the Doctor/Midwife   Always tell your doctor or midwife about any type of pain you have during pregnancy. Round ligament pain is quick and doesn't last long.   Call your health care provider immediately if you have:  severe pain  fever  chills  pain on urination  difficulty walking   Belly pain during pregnancy can be due to many different causes. It is important for your doctor to rule out more serious conditions, including pregnancy complications such as placenta abruption or non-pregnancy illnesses such as:  inguinal hernia  appendicitis  stomach, liver, and kidney problems  Preterm labor pains may sometimes be mistaken for round ligament pain.

## 2018-03-06 NOTE — MAU Provider Note (Signed)
History     CSN: [id number]  Arrival date and time: 03/06/18 1246   First Provider Initiated Contact with Patient 03/06/18 1350      Chief Complaint  Patient presents with  . Abdominal Pain   HPI   Ms.Aneesha Holloran is 27 y.o. female G1P0  here with tightening in the abdomen. Says the symptoms started yesterday. Says the tightening is not painful, at times she feels shooting pain, however they are not painful. This morning when she woke up she she felt the tightening constantly. Says she tried taking tylenol which did not help. At times the tightening radiates around to her lower back. Currently she rates her pain 2/10.   OB History    Gravida  1   Para      Term      Preterm      AB      Living        SAB      TAB      Ectopic      Multiple      Live Births              Past Medical History:  Diagnosis Date  . Ovarian cyst   . PCOS (polycystic ovarian syndrome)     Past Surgical History:  Procedure Laterality Date  . NO PAST SURGERIES    . UPPER GI ENDOSCOPY      Family History  Problem Relation Age of Onset  . Heart disease Maternal Grandmother   . Stroke Paternal Grandfather   . Heart disease Paternal Grandfather     Social History   Tobacco Use  . Smoking status: Never Smoker  . Smokeless tobacco: Never Used  Substance Use Topics  . Alcohol use: Yes    Comment: not with preg  . Drug use: No    Allergies: No Known Allergies  Medications Prior to Admission  Medication Sig Dispense Refill Last Dose  . doxylamine, Sleep, (UNISOM) 25 MG tablet Take 25 mg by mouth at bedtime as needed.   Past Week at Unknown time  . metoCLOPramide (REGLAN) 10 MG tablet Take 1 tablet (10 mg total) by mouth 3 (three) times daily with meals for 180 doses. 90 tablet 1   . ondansetron (ZOFRAN ODT) 8 MG disintegrating tablet Take 1 tablet (8 mg total) by mouth every 8 (eight) hours as needed for nausea or vomiting. 20 tablet 0 12/17/2017 at 1400  .  Prenatal Vit-Fe Fumarate-FA (PRENATAL MULTIVITAMIN) TABS tablet Take 1 tablet by mouth daily at 12 noon.   12/16/2017 at Unknown time  . pyridOXINE (VITAMIN B-6) 25 MG tablet Take 25 mg by mouth daily.   Unknown at Unknown time  . ranitidine (ZANTAC) 300 MG tablet Take 300 mg by mouth at bedtime.   12/16/2017 at Unknown time   Results for orders placed or performed during the hospital encounter of 03/06/18 (from the past 48 hour(s))  Urinalysis, Routine w reflex microscopic     Status: Abnormal   Collection Time: 03/06/18  1:06 PM  Result Value Ref Range   Color, Urine STRAW (A) YELLOW   APPearance CLEAR CLEAR   Specific Gravity, Urine 1.006 1.005 - 1.030   pH 7.0 5.0 - 8.0   Glucose, UA 150 (A) NEGATIVE mg/dL   Hgb urine dipstick NEGATIVE NEGATIVE   Bilirubin Urine NEGATIVE NEGATIVE   Ketones, ur NEGATIVE NEGATIVE mg/dL   Protein, ur NEGATIVE NEGATIVE mg/dL   Nitrite NEGATIVE NEGATIVE   Leukocytes, UA  NEGATIVE NEGATIVE    Comment: Performed at [facility], [address] [place], [place] [address]   Review of Systems  Constitutional: Negative for fever.  Gastrointestinal: Negative for abdominal pain.  Genitourinary: Negative for vaginal bleeding.   Physical Exam   Blood pressure 106/69, pulse 80, temperature 98.3 F (36.8 C), temperature source Oral, resp. rate 16, weight 152 lb 12 oz (69.3 kg), SpO2 99 %.  Physical Exam  Constitutional: She is oriented to person, place, and time. She appears well-developed and well-nourished. No distress.  HENT:  Head: Normocephalic.  Respiratory: Effort normal.  GI: Soft. She exhibits no distension. There is no tenderness. There is no rebound.  Genitourinary:  Genitourinary Comments: Dilation: Closed Exam by:: Jehiel Koepp,NP  Musculoskeletal: Normal range of motion.  Neurological: She is alert and oriented to person, place, and time.  Skin: Skin is warm. She is not diaphoretic.  Psychiatric: Her behavior is normal.   Toco: Quiet   MAU  Course  Procedures  None  MDM  NST Discussed HPI, fetal tracing and cervical exam with Dr. Charlotta Newton, ok for DC home.  + fetal heart tones via doppler   Assessment and Plan   A:  Abdominal pain in pregnancy, second trimester  Physically well but worried   P:  Discharge home in stable condition Follow up in the office tomorrow as scheduled Return to MAU if symptoms worsen   Chen Saadeh, Harolyn Rutherford, NP 03/06/2018 8:29 PM

## 2018-03-19 LAB — OB RESULTS CONSOLE ABO/RH: RH Type: NEGATIVE

## 2018-03-19 LAB — OB RESULTS CONSOLE HIV ANTIBODY (ROUTINE TESTING): HIV: NONREACTIVE

## 2018-03-19 LAB — OB RESULTS CONSOLE ANTIBODY SCREEN: ANTIBODY SCREEN: NEGATIVE

## 2018-03-19 LAB — OB RESULTS CONSOLE GC/CHLAMYDIA
Chlamydia: NEGATIVE
GC PROBE AMP, GENITAL: NEGATIVE

## 2018-03-19 LAB — OB RESULTS CONSOLE HEPATITIS B SURFACE ANTIGEN: Hepatitis B Surface Ag: NEGATIVE

## 2018-03-19 LAB — OB RESULTS CONSOLE RPR: RPR: NONREACTIVE

## 2018-03-19 LAB — OB RESULTS CONSOLE RUBELLA ANTIBODY, IGM: Rubella: IMMUNE

## 2018-06-13 LAB — OB RESULTS CONSOLE GBS: STREP GROUP B AG: NEGATIVE

## 2018-07-01 ENCOUNTER — Inpatient Hospital Stay (HOSPITAL_COMMUNITY)
Admission: AD | Admit: 2018-07-01 | Discharge: 2018-07-01 | Disposition: A | Discharge status: Home / Self Care | Payer: Commercial Managed Care - PPO | Source: Ambulatory Visit | Attending: Obstetrics & Gynecology | Admitting: Obstetrics & Gynecology

## 2018-07-01 ENCOUNTER — Encounter (HOSPITAL_COMMUNITY): Payer: Self-pay

## 2018-07-01 ENCOUNTER — Other Ambulatory Visit: Payer: Self-pay

## 2018-07-01 DIAGNOSIS — Z3A38 38 weeks gestation of pregnancy: Secondary | ICD-10-CM | POA: Insufficient documentation

## 2018-07-01 DIAGNOSIS — R1011 Right upper quadrant pain: Secondary | ICD-10-CM | POA: Diagnosis not present

## 2018-07-01 DIAGNOSIS — O26893 Other specified pregnancy related conditions, third trimester: Secondary | ICD-10-CM | POA: Diagnosis not present

## 2018-07-01 DIAGNOSIS — R030 Elevated blood-pressure reading, without diagnosis of hypertension: Secondary | ICD-10-CM | POA: Diagnosis present

## 2018-07-01 DIAGNOSIS — O133 Gestational [pregnancy-induced] hypertension without significant proteinuria, third trimester: Secondary | ICD-10-CM | POA: Diagnosis not present

## 2018-07-01 DIAGNOSIS — Z3689 Encounter for other specified antenatal screening: Secondary | ICD-10-CM

## 2018-07-01 LAB — CBC
HEMATOCRIT: 31.3 % — AB (ref 36.0–46.0)
HEMOGLOBIN: 10.6 g/dL — AB (ref 12.0–15.0)
MCH: 29.5 pg (ref 26.0–34.0)
MCHC: 33.9 g/dL (ref 30.0–36.0)
MCV: 87.2 fL (ref 78.0–100.0)
Platelets: 224 10*3/uL (ref 150–400)
RBC: 3.59 MIL/uL — ABNORMAL LOW (ref 3.87–5.11)
RDW: 13.3 % (ref 11.5–15.5)
WBC: 9.9 10*3/uL (ref 4.0–10.5)

## 2018-07-01 LAB — PROTEIN / CREATININE RATIO, URINE
Creatinine, Urine: 66 mg/dL
PROTEIN CREATININE RATIO: 0.14 mg/mg{creat} (ref 0.00–0.15)
Total Protein, Urine: 9 mg/dL

## 2018-07-01 LAB — COMPREHENSIVE METABOLIC PANEL
ALBUMIN: 3.2 g/dL — AB (ref 3.5–5.0)
ALT: 12 U/L (ref 0–44)
ANION GAP: 9 (ref 5–15)
AST: 18 U/L (ref 15–41)
Alkaline Phosphatase: 120 U/L (ref 38–126)
BILIRUBIN TOTAL: 0.6 mg/dL (ref 0.3–1.2)
BUN: 6 mg/dL (ref 6–20)
CO2: 22 mmol/L (ref 22–32)
Calcium: 8.8 mg/dL — ABNORMAL LOW (ref 8.9–10.3)
Chloride: 103 mmol/L (ref 98–111)
Creatinine, Ser: 0.37 mg/dL — ABNORMAL LOW (ref 0.44–1.00)
GFR calc Af Amer: >60 mL/min (ref >60)
GFR calc non Af Amer: >60 mL/min (ref >60)
GLUCOSE: 93 mg/dL (ref 70–99)
POTASSIUM: 4.2 mmol/L (ref 3.5–5.1)
Sodium: 134 mmol/L — ABNORMAL LOW (ref 135–145)
TOTAL PROTEIN: 6.4 g/dL — AB (ref 6.5–8.1)

## 2018-07-01 NOTE — Discharge Instructions (Signed)

## 2018-07-01 NOTE — MAU Provider Note (Signed)
History     CSN: [id number]  Arrival date and time: 07/01/18 1753   First Provider Initiated Contact with Patient 07/01/18 1852      Chief Complaint  Patient presents with  . Hypertension  . Abdominal Pain   G1 @38 .4 wks here with elevated BP. She felt weak and had RUQ pain earlier today and took her BP at home which was 160/100. Denies HA. Reports mild blurry vision. Good FM. No VB, LOF, or ctx. Her pregnancy has been uncomplicated.   OB History    Gravida  1   Para      Term      Preterm      AB      Living        SAB      TAB      Ectopic      Multiple      Live Births              Past Medical History:  Diagnosis Date  . Ovarian cyst   . PCOS (polycystic ovarian syndrome)     Past Surgical History:  Procedure Laterality Date  . NO PAST SURGERIES    . UPPER GI ENDOSCOPY      Family History  Problem Relation Age of Onset  . Heart disease Maternal Grandmother   . Stroke Paternal Grandfather   . Heart disease Paternal Grandfather     Social History   Tobacco Use  . Smoking status: Never Smoker  . Smokeless tobacco: Never Used  Substance Use Topics  . Alcohol use: Yes    Comment: not with preg  . Drug use: No    Allergies: No Known Allergies  Medications Prior to Admission  Medication Sig Dispense Refill Last Dose  . acetaminophen (TYLENOL) 325 MG tablet Take 325-650 mg by mouth every 6 (six) hours as needed for headache.   03/05/2018 at Unknown time  . doxylamine, Sleep, (UNISOM) 25 MG tablet Take 25 mg by mouth at bedtime.    03/05/2018 at Unknown time  . ondansetron (ZOFRAN ODT) 8 MG disintegrating tablet Take 1 tablet (8 mg total) by mouth every 8 (eight) hours as needed for nausea or vomiting. (Patient taking differently: Take 4 mg by mouth daily. ) 20 tablet 0 03/06/2018 at Unknown time  . Prenatal MV & Min w/FA-DHA (PRENATAL ADULT GUMMY/DHA/FA PO) Take 2 tablets by mouth daily.   03/05/2018 at Unknown time    Review of Systems   Eyes: Positive for visual disturbance.  Gastrointestinal: Positive for abdominal pain.  Genitourinary: Negative for vaginal bleeding and vaginal discharge.  Neurological: Negative for headaches.   Physical Exam   Blood pressure 109/79, pulse 98, temperature 98.2 F (36.8 C), temperature source Oral, resp. rate 17, weight 78.2 kg, SpO2 100 %. Patient Vitals for the past 24 hrs:  BP Temp Temp src Pulse Resp SpO2 Weight  07/01/18 1956 - 98.2 F (36.8 C) Oral - 17 - -  07/01/18 1845 109/79 - - 98 - - -  07/01/18 1830 113/80 - - 89 - - -  07/01/18 1815 114/80 - - (!) 106 - - -  07/01/18 1814 124/76 - - 97 - - -  07/01/18 1800 121/90 98.2 F (36.8 C) Oral (!) 107 17 100 % 78.2 kg   Physical Exam  Nursing note and vitals reviewed. Constitutional: She is oriented to person, place, and time. She appears well-developed and well-nourished. No distress.  HENT:  Head: Normocephalic and atraumatic.  Neck: Normal range of motion.  Cardiovascular: Normal rate.  Respiratory: Effort normal. No respiratory distress.  GI: Soft. She exhibits no distension. There is no tenderness.  gravid  Musculoskeletal: Normal range of motion. She exhibits no edema.  Neurological: She is alert and oriented to person, place, and time. She has normal reflexes.  Skin: Skin is warm and dry.  Psychiatric: She has a normal mood and affect.  EFM: 155 bpm, mod variability, + accels, no decels Toco: irritability  Results for orders placed or performed during the hospital encounter of 07/01/18 (from the past 24 hour(s))  CBC     Status: Abnormal   Collection Time: 07/01/18  6:20 PM  Result Value Ref Range   WBC 9.9 4.0 - 10.5 K/uL   RBC 3.59 (L) 3.87 - 5.11 MIL/uL   Hemoglobin 10.6 (L) 12.0 - 15.0 g/dL   HCT 13.031.3 (L) 86.536.0 - 78.446.0 %   MCV 87.2 78.0 - 100.0 fL   MCH 29.5 26.0 - 34.0 pg   MCHC 33.9 30.0 - 36.0 g/dL   RDW 69.613.3 29.511.5 - 28.415.5 %   Platelets 224 150 - 400 K/uL  Comprehensive metabolic panel     Status:  Abnormal   Collection Time: 07/01/18  6:20 PM  Result Value Ref Range   Sodium 134 (L) 135 - 145 mmol/L   Potassium 4.2 3.5 - 5.1 mmol/L   Chloride 103 98 - 111 mmol/L   CO2 22 22 - 32 mmol/L   Glucose, Bld 93 70 - 99 mg/dL   BUN 6 6 - 20 mg/dL   Creatinine, Ser 1.320.37 (L) 0.44 - 1.00 mg/dL   Calcium 8.8 (L) 8.9 - 10.3 mg/dL   Total Protein 6.4 (L) 6.5 - 8.1 g/dL   Albumin 3.2 (L) 3.5 - 5.0 g/dL   AST 18 15 - 41 U/L   ALT 12 0 - 44 U/L   Alkaline Phosphatase 120 38 - 126 U/L   Total Bilirubin 0.6 0.3 - 1.2 mg/dL   GFR calc non Af Amer >60 >60 mL/min   GFR calc Af Amer >60 >60 mL/min   Anion gap 9 5 - 15  Protein / creatinine ratio, urine     Status: None   Collection Time: 07/01/18  6:37 PM  Result Value Ref Range   Creatinine, Urine 66.00 mg/dL   Total Protein, Urine 9 mg/dL   Protein Creatinine Ratio 0.14 0.00 - 0.15 mg/mg[Cre]   MAU Course  Procedures  MDM Labs ordered and reviewed. No evidence of PEC. Presentation, clinical findings, and plan discussed with Dr. Juliene PinaMody. Stable for discharge home.  Assessment and Plan   1. [redacted] weeks gestation of pregnancy   2. NST (non-stress test) reactive   3. Gestational hypertension without significant proteinuria in third trimester    Discharge home Follow up in OB office tomorrow for BP check PEC precautions  Allergies as of 07/01/2018   No Known Allergies     Medication List    TAKE these medications   acetaminophen 325 MG tablet Commonly known as:  TYLENOL Take 325-650 mg by mouth every 6 (six) hours as needed for headache.   doxylamine (Sleep) 25 MG tablet Commonly known as:  UNISOM Take 25 mg by mouth at bedtime.   ondansetron 8 MG disintegrating tablet Commonly known as:  ZOFRAN-ODT Take 1 tablet (8 mg total) by mouth every 8 (eight) hours as needed for nausea or vomiting. What changed:    how much to take  when to  take this   PRENATAL ADULT GUMMY/DHA/FA PO Take 2 tablets by mouth daily.      Donette Larry, CNM 07/01/2018, 7:58 PM

## 2018-07-01 NOTE — MAU Note (Signed)
Pt is a G1P0 at 38.4 weeks sent from office for BP evaluation.  Increased BP in office.  Intermittent RUQ pain.  NO other neuro/BP concerns.

## 2018-07-01 NOTE — MAU Note (Signed)
BP up today, first occurrence.  Denies HA, 2 days ago saw some flashes, seem a little hazy now;  Pain in RUQ comes and goes; denies increase in swelling.

## 2018-07-04 ENCOUNTER — Other Ambulatory Visit: Payer: Self-pay | Admitting: Obstetrics and Gynecology

## 2018-07-04 ENCOUNTER — Encounter (HOSPITAL_COMMUNITY): Payer: Self-pay | Admitting: *Deleted

## 2018-07-04 ENCOUNTER — Telehealth (HOSPITAL_COMMUNITY): Payer: Self-pay | Admitting: *Deleted

## 2018-07-04 NOTE — Telephone Encounter (Signed)
Preadmission screen  

## 2018-07-05 ENCOUNTER — Telehealth (HOSPITAL_COMMUNITY): Payer: Self-pay | Admitting: *Deleted

## 2018-07-05 NOTE — Telephone Encounter (Signed)
Clarified arrival information

## 2018-07-08 ENCOUNTER — Inpatient Hospital Stay (HOSPITAL_COMMUNITY): Payer: Commercial Managed Care - PPO | Admitting: Anesthesiology

## 2018-07-08 ENCOUNTER — Inpatient Hospital Stay (HOSPITAL_COMMUNITY)
Admission: RE | Admit: 2018-07-08 | Discharge: 2018-07-10 | DRG: 768 | Disposition: A | Discharge status: Home / Self Care | Payer: Commercial Managed Care - PPO | Attending: Obstetrics and Gynecology | Admitting: Obstetrics and Gynecology

## 2018-07-08 ENCOUNTER — Encounter (HOSPITAL_COMMUNITY): Payer: Self-pay

## 2018-07-08 DIAGNOSIS — Z3490 Encounter for supervision of normal pregnancy, unspecified, unspecified trimester: Secondary | ICD-10-CM | POA: Diagnosis present

## 2018-07-08 DIAGNOSIS — O26893 Other specified pregnancy related conditions, third trimester: Secondary | ICD-10-CM | POA: Diagnosis present

## 2018-07-08 DIAGNOSIS — Z3A39 39 weeks gestation of pregnancy: Secondary | ICD-10-CM | POA: Diagnosis not present

## 2018-07-08 DIAGNOSIS — D62 Acute posthemorrhagic anemia: Secondary | ICD-10-CM | POA: Diagnosis not present

## 2018-07-08 DIAGNOSIS — O2243 Hemorrhoids in pregnancy, third trimester: Secondary | ICD-10-CM | POA: Diagnosis present

## 2018-07-08 DIAGNOSIS — Z6791 Unspecified blood type, Rh negative: Secondary | ICD-10-CM

## 2018-07-08 DIAGNOSIS — O9081 Anemia of the puerperium: Secondary | ICD-10-CM | POA: Diagnosis not present

## 2018-07-08 DIAGNOSIS — O26899 Other specified pregnancy related conditions, unspecified trimester: Secondary | ICD-10-CM

## 2018-07-08 DIAGNOSIS — O134 Gestational [pregnancy-induced] hypertension without significant proteinuria, complicating childbirth: Secondary | ICD-10-CM | POA: Diagnosis present

## 2018-07-08 LAB — ABO/RH: ABO/RH(D): O NEG

## 2018-07-08 LAB — COMPREHENSIVE METABOLIC PANEL
ALBUMIN: 3.3 g/dL — AB (ref 3.5–5.0)
ALT: 10 U/L (ref 0–44)
AST: 19 U/L (ref 15–41)
Alkaline Phosphatase: 130 U/L — ABNORMAL HIGH (ref 38–126)
Anion gap: 8 (ref 5–15)
BUN: 8 mg/dL (ref 6–20)
CALCIUM: 9.2 mg/dL (ref 8.9–10.3)
CO2: 22 mmol/L (ref 22–32)
CREATININE: 0.45 mg/dL (ref 0.44–1.00)
Chloride: 106 mmol/L (ref 98–111)
GFR calc Af Amer: >60 mL/min (ref >60)
GFR calc non Af Amer: >60 mL/min (ref >60)
GLUCOSE: 89 mg/dL (ref 70–99)
Potassium: 4.3 mmol/L (ref 3.5–5.1)
SODIUM: 136 mmol/L (ref 135–145)
Total Bilirubin: 0.4 mg/dL (ref 0.3–1.2)
Total Protein: 6.7 g/dL (ref 6.5–8.1)

## 2018-07-08 LAB — CBC
HCT: 32.8 % — ABNORMAL LOW (ref 36.0–46.0)
HEMOGLOBIN: 10.8 g/dL — AB (ref 12.0–15.0)
MCH: 28.6 pg (ref 26.0–34.0)
MCHC: 32.9 g/dL (ref 30.0–36.0)
MCV: 87 fL (ref 78.0–100.0)
Platelets: 226 10*3/uL (ref 150–400)
RBC: 3.77 MIL/uL — ABNORMAL LOW (ref 3.87–5.11)
RDW: 13.7 % (ref 11.5–15.5)
WBC: 10.9 10*3/uL — AB (ref 4.0–10.5)

## 2018-07-08 LAB — RPR: RPR: NONREACTIVE

## 2018-07-08 LAB — TYPE AND SCREEN
ABO/RH(D): O NEG
Antibody Screen: NEGATIVE

## 2018-07-08 MED ORDER — OXYCODONE-ACETAMINOPHEN 5-325 MG PO TABS
1.0000 | ORAL_TABLET | ORAL | Status: DC | PRN
  Administered 2018-07-08 (×2): 1 via ORAL
  Filled 2018-07-08 (×2): qty 1

## 2018-07-08 MED ORDER — LACTATED RINGERS IV SOLN
500.0000 mL | Freq: Once | INTRAVENOUS | Status: AC
  Administered 2018-07-08: 500 mL via INTRAVENOUS

## 2018-07-08 MED ORDER — DIPHENHYDRAMINE HCL 25 MG PO CAPS: 25.0000 mg | ORAL_CAPSULE | Freq: Four times a day (QID) | ORAL | Status: DC | PRN

## 2018-07-08 MED ORDER — EPHEDRINE 5 MG/ML INJ
10.0000 mg | INTRAVENOUS | Status: DC | PRN
  Filled 2018-07-08: qty 2

## 2018-07-08 MED ORDER — PHENYLEPHRINE 40 MCG/ML (10ML) SYRINGE FOR IV PUSH (FOR BLOOD PRESSURE SUPPORT)
80.0000 ug | PREFILLED_SYRINGE | INTRAVENOUS | Status: DC | PRN
  Filled 2018-07-08: qty 5

## 2018-07-08 MED ORDER — ONDANSETRON HCL 4 MG/2ML IJ SOLN: 4.0000 mg | INTRAMUSCULAR | Status: DC | PRN

## 2018-07-08 MED ORDER — COCONUT OIL OIL: 1.0000 "application " | TOPICAL_OIL | Status: DC | PRN

## 2018-07-08 MED ORDER — LACTATED RINGERS IV SOLN: 500.0000 mL | INTRAVENOUS | Status: DC | PRN

## 2018-07-08 MED ORDER — PHENYLEPHRINE 40 MCG/ML (10ML) SYRINGE FOR IV PUSH (FOR BLOOD PRESSURE SUPPORT)
80.0000 ug | PREFILLED_SYRINGE | INTRAVENOUS | Status: DC | PRN
  Filled 2018-07-08: qty 5
  Filled 2018-07-08: qty 10

## 2018-07-08 MED ORDER — ACETAMINOPHEN 325 MG PO TABS
650.0000 mg | ORAL_TABLET | ORAL | Status: DC | PRN
  Administered 2018-07-09 – 2018-07-10 (×7): 650 mg via ORAL
  Filled 2018-07-08 (×7): qty 2

## 2018-07-08 MED ORDER — TERBUTALINE SULFATE 1 MG/ML IJ SOLN
0.2500 mg | Freq: Once | INTRAMUSCULAR | Status: DC | PRN
  Filled 2018-07-08: qty 1

## 2018-07-08 MED ORDER — IBUPROFEN 600 MG PO TABS
600.0000 mg | ORAL_TABLET | Freq: Four times a day (QID) | ORAL | Status: DC
  Administered 2018-07-08 – 2018-07-10 (×7): 600 mg via ORAL
  Filled 2018-07-08 (×7): qty 1

## 2018-07-08 MED ORDER — PRENATAL MULTIVITAMIN CH
1.0000 | ORAL_TABLET | Freq: Every day | ORAL | Status: DC
  Administered 2018-07-09: 1 via ORAL
  Filled 2018-07-08: qty 1

## 2018-07-08 MED ORDER — WITCH HAZEL-GLYCERIN EX PADS
1.0000 "application " | MEDICATED_PAD | CUTANEOUS | Status: DC | PRN
  Administered 2018-07-09: 1 via TOPICAL

## 2018-07-08 MED ORDER — OXYTOCIN 40 UNITS IN LACTATED RINGERS INFUSION - SIMPLE MED: 2.5000 [IU]/h | INTRAVENOUS | Status: DC

## 2018-07-08 MED ORDER — DIBUCAINE 1 % RE OINT
1.0000 "application " | TOPICAL_OINTMENT | RECTAL | Status: DC | PRN
  Administered 2018-07-09: 1 via RECTAL
  Filled 2018-07-08: qty 28

## 2018-07-08 MED ORDER — ONDANSETRON HCL 4 MG/2ML IJ SOLN: 4.0000 mg | Freq: Four times a day (QID) | INTRAMUSCULAR | Status: DC | PRN

## 2018-07-08 MED ORDER — DIPHENHYDRAMINE HCL 50 MG/ML IJ SOLN: 12.5000 mg | INTRAMUSCULAR | Status: DC | PRN

## 2018-07-08 MED ORDER — SENNOSIDES-DOCUSATE SODIUM 8.6-50 MG PO TABS
2.0000 | ORAL_TABLET | ORAL | Status: DC
  Administered 2018-07-08 – 2018-07-09 (×2): 2 via ORAL
  Filled 2018-07-08 (×2): qty 2

## 2018-07-08 MED ORDER — ACETAMINOPHEN 325 MG PO TABS
650.0000 mg | ORAL_TABLET | ORAL | Status: DC | PRN
  Administered 2018-07-08: 650 mg via ORAL
  Filled 2018-07-08: qty 2

## 2018-07-08 MED ORDER — TETANUS-DIPHTH-ACELL PERTUSSIS 5-2.5-18.5 LF-MCG/0.5 IM SUSP: 0.5000 mL | Freq: Once | INTRAMUSCULAR | Status: DC

## 2018-07-08 MED ORDER — LACTATED RINGERS IV SOLN
INTRAVENOUS | Status: DC
  Administered 2018-07-08: 08:00:00 via INTRAVENOUS

## 2018-07-08 MED ORDER — OXYTOCIN 40 UNITS IN LACTATED RINGERS INFUSION - SIMPLE MED
1.0000 m[IU]/min | INTRAVENOUS | Status: DC
  Administered 2018-07-08: 2 m[IU]/min via INTRAVENOUS
  Filled 2018-07-08: qty 1000

## 2018-07-08 MED ORDER — LIDOCAINE HCL (PF) 1 % IJ SOLN
30.0000 mL | INTRAMUSCULAR | Status: DC | PRN
  Filled 2018-07-08: qty 30

## 2018-07-08 MED ORDER — ZOLPIDEM TARTRATE 5 MG PO TABS: 5.0000 mg | ORAL_TABLET | Freq: Every evening | ORAL | Status: DC | PRN

## 2018-07-08 MED ORDER — FENTANYL 2.5 MCG/ML BUPIVACAINE 1/10 % EPIDURAL INFUSION (WH - ANES)
14.0000 mL/h | INTRAMUSCULAR | Status: DC | PRN
  Administered 2018-07-08: 14 mL/h via EPIDURAL
  Filled 2018-07-08: qty 100

## 2018-07-08 MED ORDER — OXYCODONE-ACETAMINOPHEN 5-325 MG PO TABS
2.0000 | ORAL_TABLET | ORAL | Status: DC | PRN
  Filled 2018-07-08: qty 2

## 2018-07-08 MED ORDER — OXYTOCIN BOLUS FROM INFUSION
500.0000 mL | Freq: Once | INTRAVENOUS | Status: AC
  Administered 2018-07-08: 500 mL via INTRAVENOUS

## 2018-07-08 MED ORDER — BENZOCAINE-MENTHOL 20-0.5 % EX AERO
1.0000 "application " | INHALATION_SPRAY | CUTANEOUS | Status: DC | PRN
  Administered 2018-07-08 – 2018-07-10 (×2): 1 via TOPICAL
  Filled 2018-07-08 (×2): qty 56

## 2018-07-08 MED ORDER — LIDOCAINE-EPINEPHRINE (PF) 2 %-1:200000 IJ SOLN
INTRAMUSCULAR | Status: DC | PRN
  Administered 2018-07-08 (×2): 4 mL via EPIDURAL

## 2018-07-08 MED ORDER — SOD CITRATE-CITRIC ACID 500-334 MG/5ML PO SOLN: 30.0000 mL | ORAL | Status: DC | PRN

## 2018-07-08 MED ORDER — METHYLERGONOVINE MALEATE 0.2 MG/ML IJ SOLN: 0.2000 mg | INTRAMUSCULAR | Status: DC | PRN

## 2018-07-08 MED ORDER — METHYLERGONOVINE MALEATE 0.2 MG PO TABS: 0.2000 mg | ORAL_TABLET | ORAL | Status: DC | PRN

## 2018-07-08 MED ORDER — SIMETHICONE 80 MG PO CHEW: 80.0000 mg | CHEWABLE_TABLET | ORAL | Status: DC | PRN

## 2018-07-08 MED ORDER — ONDANSETRON HCL 4 MG PO TABS
4.0000 mg | ORAL_TABLET | ORAL | Status: DC | PRN
  Administered 2018-07-10: 4 mg via ORAL
  Filled 2018-07-08: qty 1

## 2018-07-08 NOTE — Progress Notes (Signed)
Molly Chase is a 27 y.o. G1P0 at [redacted]w[redacted]d by LMP admitted for IOL/GHTN Here for AROM per MD request.  Subjective:  Comfortable w/ ctx, denies PEC s/sx, family at [facility] and supportive.  AROM offered and agrees, all questions answered. Pitocin titration ongoing.   Objective: Vitals:   07/08/18 0753 07/08/18 0832 07/08/18 0903 07/08/18 0934  BP: 130/83 116/74 104/70 117/70  Pulse: 91 86 76 79  Resp: 17 17 17 18   Temp: 98.8 F (37.1 C)     TempSrc: Oral     Weight:      Height:        No intake/output data recorded. No intake/output data recorded.   FHT:  FHR: 135 bpm, variability: moderate,  accelerations:  Present,  decelerations:  Absent UC:   irregular, every 2-5 minutes SVE:   Dilation: 4.5 Effacement (%): 90 Station: -2 Exam by:: Colon Flattery CNM AROM, clear AF  Labs:   Recent Labs    07/08/18 0750  WBC 10.9*  HGB 10.8*  HCT 32.8*  PLT 226    Assessment / Plan: Induction of labor due to gestational hypertension,  progressing well on pitocin  Labor: Progressing normally, continue Pitocin, titrate to active labor ctx Preeclampsia:  labs stable and normotensive, no neural s/sx Fetal Wellbeing:  Category I Pain Control:  planned epidural I/D:  GBS neg Anticipated MOD:  NSVD  Neta Mends, CNM, MSN 07/08/2018, 10:14 AM

## 2018-07-08 NOTE — Anesthesia Pain Management Evaluation Note (Signed)
  CRNA Pain Management Visit Note  Patient: Molly Chase, 27 y.o., female  "Hello I am a member of the anesthesia team at [facility]. We have an anesthesia team available at all times to provide care throughout the hospital, including epidural management and anesthesia for C-section. I don't know your plan for the delivery whether it a natural birth, water birth, IV sedation, nitrous supplementation, doula or epidural, but we want to meet your pain goals."   1.Was your pain managed to your expectations on prior hospitalizations?   Yes   2.What is your expectation for pain management during this hospitalization?     Epidural  3.How can we help you reach that goal?   Record the patient's initial score and the patient's pain goal.   Pain: 4  Pain Goal: 4 [facility] wants you to be able to say your pain was always managed very well.  Laban Emperor 07/08/2018

## 2018-07-08 NOTE — Progress Notes (Signed)
Molly Chase is a 27 y.o. G1P0 at [redacted]w[redacted]d by LMP admitted for induction of labor due to Hypertension.  Subjective: Comfortable  Objective: BP 118/72   Pulse 70   Temp 97.7 F (36.5 C) (Oral)   Resp 18   Ht 5\' 3"  (1.6 m)   Wt 78.7 kg   SpO2 100%   BMI 30.73 kg/m  No intake/output data recorded. Total I/O In: 276.4 [I.V.:276.4] Out: -   FHT:  FHR: 145 bpm, variability: moderate,  accelerations:  Present,  decelerations:  Present occ mild variable UC:   regular, every 2-3 minutes SVE:   Dilation: 10 Effacement (%): 90 Station: Plus 2, Plus 3 Exam by:: Becci Batty  Labs: Lab Results  Component Value Date   WBC 10.9 (H) 07/08/2018   HGB 10.8 (L) 07/08/2018   HCT 32.8 (L) 07/08/2018   MCV 87.0 07/08/2018   PLT 226 07/08/2018    Assessment / Plan: Induction of labor due to gestational hypertension,  progressing well on pitocin  Labor: Progressing normally Preeclampsia:  no signs or symptoms of toxicity Fetal Wellbeing:  Category I Pain Control:  Epidural I/D:  n/a Anticipated MOD:  NSVD  Rachele Lamaster J 07/08/2018, 2:21 PM

## 2018-07-08 NOTE — H&P (Signed)
Molly Chase is a 27 y.o. female presenting for IOL for gestational htn. Pt declined earlier IOL OB History    Gravida  1   Para      Term      Preterm      AB      Living        SAB      TAB      Ectopic      Multiple      Live Births             Past Medical History:  Diagnosis Date  . H pylori ulcer   . Ovarian cyst   . PCOS (polycystic ovarian syndrome)    Past Surgical History:  Procedure Laterality Date  . NO PAST SURGERIES    . UPPER GI ENDOSCOPY     Family History: family history includes Diabetes in her maternal grandfather and maternal grandmother; Heart attack in her maternal grandfather; Heart disease in her maternal grandmother and paternal grandfather; Hypertension in her maternal grandfather and maternal grandmother; Pulmonary embolism in her paternal grandfather; Stroke in her paternal grandfather. Social History:  reports that she has never smoked. She has never used smokeless tobacco. She reports that she drinks alcohol. She reports that she does not use drugs.     Maternal Diabetes: No Genetic Screening: Normal Maternal Ultrasounds/Referrals: Normal Fetal Ultrasounds or other Referrals:  None Maternal Substance Abuse:  No Significant Maternal Medications:  None Significant Maternal Lab Results:  None Other Comments:  None  Review of Systems  Constitutional: Negative.   All other systems reviewed and are negative.  Maternal Medical History:  Contractions: Onset was less than 1 hour ago.   Perceived severity is mild.    Fetal activity: Perceived fetal activity is normal.   Last perceived fetal movement was within the past hour.    Prenatal complications: PIH.   Prenatal Complications - Diabetes: none.      There were no vitals taken for this visit. Maternal Exam:  Uterine Assessment: Contraction strength is mild.  Abdomen: Patient reports no abdominal tenderness. Fetal presentation: vertex  Introitus: Normal vulva. Normal  vagina.  Ferning test: not done.  Nitrazine test: not done. Amniotic fluid character: not assessed.  Pelvis: adequate for delivery.      Physical Exam  Nursing note and vitals reviewed. Constitutional: She is oriented to person, place, and time. She appears well-developed and well-nourished.  Neck: Normal range of motion. Neck supple.  Cardiovascular: Normal rate and regular rhythm.  Respiratory: Effort normal and breath sounds normal.  GI: Soft. Bowel sounds are normal.  Genitourinary: Vagina normal and uterus normal.  Musculoskeletal: Normal range of motion.  Neurological: She is alert and oriented to person, place, and time. She has normal reflexes.  Skin: Skin is warm and dry.  Psychiatric: She has a normal mood and affect.    Prenatal labs: ABO, Rh: O/Negative/-- (06/11 0000) Antibody: Negative (06/11 0000) Rubella: Immune (06/11 0000) RPR: Nonreactive (06/11 0000)  HBsAg: Negative (06/11 0000)  HIV: Non-reactive (06/11 0000)  GBS: Negative (09/05 0000)   Assessment/Plan: 39 week IUP Gestational HTN, no s/s PEC Ck labs IOL   Molly Chase J 07/08/2018, 7:29 AM

## 2018-07-08 NOTE — Anesthesia Procedure Notes (Signed)
Epidural Patient location during procedure: OB Start time: 07/08/2018 10:15 AM End time: 07/08/2018 10:30 AM  Staffing Anesthesiologist: Elmer Picker, MD Performed: anesthesiologist   Preanesthetic Checklist Completed: patient identified, pre-op evaluation, timeout performed, IV checked, risks and benefits discussed and monitors and equipment checked  Epidural Patient position: sitting Prep: site prepped and draped and DuraPrep Patient monitoring: continuous pulse ox, blood pressure, heart rate and cardiac monitor Approach: midline Location: L3-L4 Injection technique: LOR air  Needle:  Needle type: Tuohy  Needle gauge: 17 G Needle length: 9 cm Needle insertion depth: 4 cm Catheter type: closed end flexible Catheter size: 19 Gauge Catheter at skin depth: 10 cm Test dose: negative  Assessment Sensory level: T8 Events: blood not aspirated, injection not painful, no injection resistance, negative IV test and no paresthesia  Additional Notes Patient identified. Risks/Benefits/Options discussed with patient including but not limited to bleeding, infection, nerve damage, paralysis, failed block, incomplete pain control, headache, blood pressure changes, nausea, vomiting, reactions to medication both or allergic, itching and postpartum back pain. Confirmed with bedside nurse the patient's most recent platelet count. Confirmed with patient that they are not currently taking any anticoagulation, have any bleeding history or any family history of bleeding disorders. Patient expressed understanding and wished to proceed. All questions were answered. Sterile technique was used throughout the entire procedure. Please see nursing notes for vital signs. Test dose was given through epidural catheter and negative prior to continuing to dose epidural or start infusion. Warning signs of high block given to the patient including shortness of breath, tingling/numbness in hands, complete motor block,  or any concerning symptoms with instructions to call for help. Patient was given instructions on fall risk and not to get out of bed. All questions and concerns addressed with instructions to call with any issues or inadequate analgesia.  Reason for block:procedure for pain

## 2018-07-08 NOTE — Anesthesia Preprocedure Evaluation (Signed)
Anesthesia Evaluation  Patient identified by MRN, date of birth, ID band Patient awake    Reviewed: Allergy & Precautions, NPO status , Patient's Chart, lab work & pertinent test results  Airway Mallampati: I  TM Distance: >3 FB Neck ROM: Full    Dental no notable dental hx. (+) Teeth Intact   Pulmonary neg pulmonary ROS,    Pulmonary exam normal breath sounds clear to auscultation       Cardiovascular hypertension (gestational), Normal cardiovascular exam Rhythm:Regular Rate:Normal     Neuro/Psych negative neurological ROS  negative psych ROS   GI/Hepatic negative GI ROS, Neg liver ROS,   Endo/Other  negative endocrine ROS  Renal/GU negative Renal ROS  negative genitourinary   Musculoskeletal negative musculoskeletal ROS (+)   Abdominal   Peds  Hematology negative hematology ROS (+)   Anesthesia Other Findings   Reproductive/Obstetrics (+) Pregnancy                             Anesthesia Physical Anesthesia Plan  ASA: II  Anesthesia Plan: Epidural   Post-op Pain Management:    Induction:   PONV Risk Score and Plan: Treatment may vary due to age or medical condition  Airway Management Planned: Natural Airway  Additional Equipment:   Intra-op Plan:   Post-operative Plan:   Informed Consent: I have reviewed the patients History and Physical, chart, labs and discussed the procedure including the risks, benefits and alternatives for the proposed anesthesia with the patient or authorized representative who has indicated his/her understanding and acceptance.     Plan Discussed with:   Anesthesia Plan Comments:         Anesthesia Quick Evaluation

## 2018-07-09 LAB — CBC
HEMATOCRIT: 25.9 % — AB (ref 36.0–46.0)
Hemoglobin: 8.7 g/dL — ABNORMAL LOW (ref 12.0–15.0)
MCH: 29.2 pg (ref 26.0–34.0)
MCHC: 33.6 g/dL (ref 30.0–36.0)
MCV: 86.9 fL (ref 78.0–100.0)
PLATELETS: 173 10*3/uL (ref 150–400)
RBC: 2.98 MIL/uL — ABNORMAL LOW (ref 3.87–5.11)
RDW: 13.7 % (ref 11.5–15.5)
WBC: 12.9 10*3/uL — AB (ref 4.0–10.5)

## 2018-07-09 MED ORDER — MAGNESIUM OXIDE 400 (241.3 MG) MG PO TABS
400.0000 mg | ORAL_TABLET | Freq: Every day | ORAL | Status: DC
  Administered 2018-07-09: 400 mg via ORAL
  Filled 2018-07-09 (×4): qty 1

## 2018-07-09 MED ORDER — POLYSACCHARIDE IRON COMPLEX 150 MG PO CAPS
150.0000 mg | ORAL_CAPSULE | Freq: Every day | ORAL | Status: DC
  Administered 2018-07-09: 150 mg via ORAL
  Filled 2018-07-09 (×2): qty 1

## 2018-07-09 MED ORDER — HYDROCORTISONE ACE-PRAMOXINE 1-1 % RE FOAM: 1.0000 | Freq: Two times a day (BID) | RECTAL | Status: DC

## 2018-07-09 MED ORDER — RHO D IMMUNE GLOBULIN 1500 UNIT/2ML IJ SOSY
300.0000 ug | PREFILLED_SYRINGE | Freq: Once | INTRAMUSCULAR | Status: AC
  Administered 2018-07-09: 300 ug via INTRAVENOUS
  Filled 2018-07-09: qty 2

## 2018-07-09 NOTE — Anesthesia Postprocedure Evaluation (Signed)
Anesthesia Post Note  Patient: Molly Chase  Procedure(s) Performed: AN AD HOC LABOR EPIDURAL     Patient location during evaluation: Mother Baby Anesthesia Type: Epidural Level of consciousness: awake, awake and alert and oriented Pain management: pain level controlled Vital Signs Assessment: post-procedure vital signs reviewed and stable Respiratory status: spontaneous breathing and respiratory function stable Cardiovascular status: blood pressure returned to baseline Postop Assessment: no headache, no backache, epidural receding, patient able to bend at knees, no apparent nausea or vomiting, adequate PO intake and able to ambulate Anesthetic complications: no    Last Vitals:  Vitals:   07/09/18 0200 07/09/18 0545  BP: 110/76 120/77  Pulse: 86 84  Resp: 18 18  Temp: 36.8 C 36.8 C  SpO2:      Last Pain:  Vitals:   07/09/18 0545  TempSrc: Oral  PainSc: 5    Pain Goal:                 Cleda Clarks

## 2018-07-09 NOTE — Lactation Note (Signed)
This note was copied from a baby's chart. Lactation Consultation Note  Patient Name: Molly Chase [id number] Date: 07/09/2018 Reason for consult: Initial assessment;Primapara;1st time breastfeeding;Term  9 hours old FT female who is being exclusively BF by her mother, she's a P1. Mom has a Spectra DEBP at home. RN was already assisting mom with hand expression when entering the room, LC showed mom how to spoon feed and finger fed baby a few drops of colostrum. Baby's sucking patter was very uncoordinated.  Offered assistance with latch, mom agreed to have baby STS, LC took baby to the left breast in football position but he did not latch, mom said position felt "unnatural". Switched to cross cradle, LC tried to position baby and corrected positioning multiple times but mom kept trying to latch baby on her own, baby did not latch. Mom inquired about pumping at the hospital, cluster feeding was discussed  Feeding Plan:  1. Encouraged mom to feed baby STS 8-12 times/24 hours or sooner if feeding cues are present 2. Hand expression and spoon feeding were also encouraged  BF brochure, BF resources and feeding diary were reviewed, both parents aware of LC services and will call PRN.  Maternal Data Formula Feeding for Exclusion: No Has patient been taught Hand Expression?: Yes Does the patient have breastfeeding experience prior to this delivery?: No  Feeding Feeding Type: Breast Milk  Interventions Interventions: Breast feeding basics reviewed;Assisted with latch;Skin to skin;Breast massage;Breast compression;Hand express;Support pillows;Position options  Lactation Tools Discussed/Used Tools: Other (comment)(spoon) WIC Program: No   Consult Status Consult Status: Follow-up Date: 07/09/18 Follow-up type: In-patient    Molly Chase 07/09/2018, 12:01 AM

## 2018-07-09 NOTE — Lactation Note (Signed)
This note was copied from a baby's chart. Lactation Consultation Note  Patient Name: Molly Chase [id number] Date: 07/09/2018 Reason for consult: Follow-up assessment;1st time breastfeeding;Primapara  Hx of PCOS  Baby is 60 hours old.  MBU-RN concerned baby today during the day baby is only staying latched for 5 mins.  While LC was in the room, mom latched the baby in a laid back position, and c/o  Pinching. LC adjusted the baby's mouth , flipped upper lip and eased chin, still intermittently  Pinching. Baby released and LC assisted and showed mom to work on getting the baby to open wide  And then latch. Showed dad how to check lip lines and flip open and ease chin. Mom mentioned the latch  Felt better, and when the baby released the nipple was well rounded.  4 wets/ 5 stools. LC instructed mom on the use of comfort gels after feedings or pumping , when warm  Rinse with warm water, switch to the shells when awake. And hand pump ( #24 F is a good fit)  MBURN aware of the [facility] plan.    LC recommended to mom Sore nipple prevention and tx  Comfort gels after feedings or pumping and alternate with hand pump.  Prior to latch breast massage , hand express, pre-pump with hand pump to make the Nipple / areola complex more elastic and prime the milk ducts.  Firm support. Breast compressions with latch.  Mom has her DEBP - Spectra - LC encouraged mom to post pump  With her DEBP if she desired and spoon feed EBM back to baby when the baby isn't  Cluster feeding.     Maternal Data Has patient been taught Hand Expression?: Yes  Feeding Feeding Type: Breast Fed Length of feed: (multiple swallows noted )  LATCH Score Latch: Grasps breast easily, tongue down, lips flanged, rhythmical sucking.  Audible Swallowing: Spontaneous and intermittent  Type of Nipple: Everted at rest and after stimulation  Comfort (Breast/Nipple): Filling, red/small blisters or bruises, mild/mod discomfort  Hold  (Positioning): Assistance needed to correctly position infant at breast and maintain latch.  LATCH Score: 8  Interventions Interventions: Breast feeding basics reviewed;Assisted with latch;Skin to skin;Breast massage;Breast compression;Adjust position;Support pillows;Position options;Shells;Hand pump  Lactation Tools Discussed/Used Tools: Shells;Pump;Comfort gels;Flanges Flange Size: 24 Shell Type: Inverted Breast pump type: Manual Pump Review: Milk Storage   Consult Status Consult Status: Follow-up Date: 07/10/18 Follow-up type: In-patient    Molly Chase 07/09/2018, 2:45 PM

## 2018-07-09 NOTE — Progress Notes (Addendum)
PPD #1, SVD, 3rd degree repair, baby girl "Blaire"  S:  Reports feeling okay, very tired and sore; has only had a 20 minute nap today             Tolerating po/ No nausea or vomiting / Denies CP or SOB, reports some dizziness earlier today with walking               Bleeding is heavy, no clots              Pain somewhat controlled with Motrin and Tylenol, took some Percocet, but states she'd like to avoid it   Reports pain from hemorrhoids              Up ad lib / ambulatory / voiding QS  Newborn breast feeding - reports baby is having difficulty latching because she is falling asleep.  She is concerned for a newborn rash.    O:               VS: BP 120/77 (BP Location: Right Arm)   Pulse 84   Temp 98.3 F (36.8 C) (Oral)   Resp 18   Ht 5\' 3"  (1.6 m)   Wt 78.7 kg   SpO2 100%   Breastfeeding? Unknown   BMI 30.73 kg/m    LABS:              Recent Labs    07/08/18 0750 07/09/18 0636  WBC 10.9* 12.9*  HGB 10.8* 8.7*  PLT 226 173               Blood type: --/--/O NEG, O NEG Performed at [facility], [address], [place], [place] [address]  [phone])  Rubella: Immune (06/11 0000)                     I&O: Intake/Output      09/30 0701 - 10/01 0700 10/01 0701 - 10/02 0700   I.V. (mL/kg) 276.4 (3.5)    Total Intake(mL/kg) 276.4 (3.5)    Urine (mL/kg/hr) 800 (0.4)    Blood 327    Total Output 1127    Net -850.6         Urine Occurrence 3 x                  Physical Exam:             Alert and oriented X3  Lungs: Clear and unlabored  Heart: regular rate and rhythm / no murmurs  Abdomen: soft, non-tender, non-distended              Fundus: firm, non-tender, U-1  Perineum: mild edema and erythema, no ecchymosis, well approximated 3rd degree laceration,   Lochia: moderate, no clots   Extremities: no edema, no calf pain or tenderness    A: PPD # 1, SVD  3rd degree repair  ABL Anemia compounding IDA  RH Negative - baby RH Positive  Hemorrhoids   Doing well  - stable status  P: Routine post partum orders  Give Rhogam today  Niferex 150mg  PO daily  Magnesium oxide 400mg  daily  See lactation today  Encouraged to rest when baby rests  Anticipatory guidance for bleeding and repair discussed  Proctofoam BID for hemorrhoids   Anticipate discharge home tomorrow   Carlean Jews, MSN, CNM [facility]

## 2018-07-10 ENCOUNTER — Other Ambulatory Visit: Payer: Self-pay

## 2018-07-10 ENCOUNTER — Encounter (HOSPITAL_COMMUNITY): Payer: Self-pay

## 2018-07-10 LAB — RH IG WORKUP (INCLUDES ABO/RH)
ABO/RH(D): O NEG
FETAL SCREEN: NEGATIVE
Gestational Age(Wks): 39.4
UNIT DIVISION: 0

## 2018-07-10 MED ORDER — IBUPROFEN 600 MG PO TABS: 600.0000 mg | ORAL_TABLET | Freq: Four times a day (QID) | ORAL | 0 refills | Status: DC

## 2018-07-10 MED ORDER — FERROUS SULFATE 325 (65 FE) MG PO TABS: 325.0000 mg | ORAL_TABLET | Freq: Two times a day (BID) | ORAL | 3 refills | Status: DC

## 2018-07-10 MED ORDER — SENNOSIDES-DOCUSATE SODIUM 8.6-50 MG PO TABS: 2.0000 | ORAL_TABLET | ORAL | 0 refills | Status: DC

## 2018-07-10 NOTE — Lactation Note (Signed)
This note was copied from a baby's chart. Lactation Consultation Note  Patient Name: Girl Marque Bango BJYNW'G Date: 07/10/2018 Reason for consult: Follow-up assessment;Mother's request;Term LC entered room mom not interested with latching infant to breast at this time , wants her  breast to heal. Per mom , she gave infant her EBM and then 15 ml of formula. Per mom she has been doing hand expression and giving EBM back to infant and supplement with formula currently. Mom is aware of formula risk and had  LEAD discussion with nurse. Mom has lanolin on the counter LC discussed possible risk of thrust with lanolin usage, nurse in room and will give mom  coconut oil. LC notice mom had comfort gels but was not using them, LC suggested mom to use  Coconut oil mom receptive of idea and  LC help assist her, mom was pleased with how the comfort gels  felt. LC explain not to use comfort gels and coconut oil together. Mom will call Nurse or LC  when she is ready for help or assistance with latching infant to breast.   Maternal Data    Feeding    LATCH Score                   Interventions    Lactation Tools Discussed/Used     Consult Status Consult Status: Follow-up Date: 07/10/18 Follow-up type: In-patient    Danelle Earthly 07/10/2018, 12:06 AM

## 2018-07-10 NOTE — Lactation Note (Signed)
This note was copied from a baby's chart. Lactation Consultation Note  Patient Name: Girl Lynne Takemoto RUEAV'W Date: 07/10/2018 Reason for consult: Follow-up assessment;Nipple pain/trauma  Visited with P1 Mom of term baby at 13 hrs old.  Baby at 5% weight loss.  Mom has had difficulty with latching baby, and has been supplementing baby using formula with a curved tip syringe. Mom has been using hand pump, but has not used double pump yet.  Baby didn't go to breast last night.  Offered to review positioning and latch, but baby had been fed 15 ml of formula an hour ago. Undressed baby and changed diaper (large transitional stool).  Baby awakened well.  Positioned Mom to latch baby in football hold on left breast.  Baby would not open or root at all.  Reviewed what a deep latch would feel and look like.  Reviewed breast massage and hand expression, drops of colostrum easily expressed. Taught Mom and FOB how to bottle feed using the Paced Method of feeding.    Plan- 1- Keep baby STS as much as possible 2- Offer breast when baby cues to eat.  Goal of 8-12 feedings per 24 hrs. 3- If baby doesn't latch and feed with a deep latch, hearing swallows for >10 mins, baby to be supplemented using EBM+/ formula per volume guidelines provided.  To increase volume to 30 ml today. 4- Double pump if baby is being fed other than at the breast.  Mom understands importance of breast supply stimulation. 5- Follow-up with OP lactation. (Mom states she has a friend who is an Printmaker and may get assistance at home)  Request made to Clinic for OP appointment.  Consult Status Consult Status: Complete Date: 07/10/18 Follow-up type: Out-patient    Judee Clara 07/10/2018, 12:44 PM

## 2018-07-10 NOTE — Discharge Summary (Signed)
Obstetric Discharge Summary   Patient Name: Molly Chase DOB: 01/24/1991 MRN: [id number]  Date of Admission: 07/08/2018 Date of Discharge: 07/10/2018 Date of Delivery: 07/08/18 Gestational Age at Delivery: [redacted]w[redacted]d  Primary OB:  Transfer of care at 23 weeks from [facility] - Dr. Billy Coast  Antepartum complications:  - GHTN - Low lying placenta - resolved  - Anemia  - RH Negative s/p Rhogam Prenatal Labs:  ABO, Rh: O/Negative/-- (06/11 0000) Antibody: Negative (06/11 0000) Rubella: Immune (06/11 0000) RPR: Nonreactive (06/11 0000)  HBsAg: Negative (06/11 0000)  HIV: Non-reactive (06/11 0000)  GBS: Negative (09/05 0000)  Admitting Diagnosis: IOL for gestational hypertension  Secondary Diagnoses: Patient Active Problem List   Diagnosis Date Noted  . Encounter for planned induction of labor 07/08/2018  . SVD 9/30 07/08/2018  . Postpartum care following vaginal delivery 07/08/2018  . Perineal laceration with delivery, third degree 07/08/2018  . Rh negative, maternal 07/08/2018    Augmentation: AROM, Pitocin  Date of Delivery: 07/08/18 Delivered By: Dr. Billy Coast Delivery Type: spontaneous vaginal delivery Anesthesia: epidural Placenta: sponatneous Laceration: 3rd degree  Episiotomy: none  Newborn Data: Live born female  Birth Weight: 7 lb 8.3 oz (3410 g) APGAR: 8, 9  Newborn Delivery   Birth date/time:  07/08/2018 14:51:00 Delivery type:  Vaginal, Spontaneous      Hospital/Postpartum Course  (Vaginal Delivery): Pt. Admitted for IOL for gestational hypertension. Labs normal and no neural s/s.  Pt. Progressed with AROM and Pitocin.  She had a NSVD with partial 3rd degree laceration. See delivery note  Patient had an uncomplicated postpartum course.  By time of discharge on PPD#2, her pain was controlled on oral pain medications; she had appropriate lochia and was ambulating, voiding without difficulty and tolerating regular diet.  She was deemed stable for  discharge to home.     Labs: CBC Latest Ref Rng & Units 07/09/2018 07/08/2018 07/01/2018  WBC 4.0 - 10.5 K/uL 12.9(H) 10.9(H) 9.9  Hemoglobin 12.0 - 15.0 g/dL 7.8(G) 10.8(L) 10.6(L)  Hematocrit 36.0 - 46.0 % 25.9(L) 32.8(L) 31.3(L)  Platelets 150 - 400 K/uL 173 226 224   O NEG  Physical exam:  BP 121/81 (BP Location: Right Arm)   Pulse 85   Temp 98.1 F (36.7 C) (Oral)   Resp 16   Ht 5\' 3"  (1.6 m)   Wt 78.7 kg   SpO2 100%   Breastfeeding? Unknown   BMI 30.73 kg/m  General: alert and no distress Pulm: normal respiratory effort Lochia: appropriate Abdomen: soft, NT Uterine Fundus: firm, below umbilicus Perineum: healing well, no significant erythema, no significant edema Extremities: No evidence of DVT seen on physical exam. Trace lower extremity edema.   Disposition: stable, discharge to home Baby Feeding: breast milk and formula Baby Disposition: home with mom  Contraception: unsure  Rh Immune globulin given: Given 07/09/18 Rubella vaccine given: N/A Tdap vaccine given in AP or PP setting: UTD Flu vaccine given in AP or PP setting: UTD   Plan:  Molly Chase was discharged to home in good condition. Follow-up appointment at [facility] in 1 week for BP check and 6 week PP visit.  Discharge Instructions: Per After Visit Summary. Refer to After Visit Summary and [facility] discharge booklet  Activity: Advance as tolerated. Pelvic rest for 6 weeks.   Diet: Regular, Heart Healthy Discharge Medications: Allergies as of 07/10/2018   No Known Allergies     Medication List    STOP taking these medications   doxylamine (Sleep) 25  MG tablet Commonly known as:  UNISOM   ondansetron 8 MG disintegrating tablet Commonly known as:  ZOFRAN-ODT     TAKE these medications   acetaminophen 325 MG tablet Commonly known as:  TYLENOL Take 325-650 mg by mouth every 6 (six) hours as needed for headache.   ferrous sulfate 325 (65 FE) MG tablet Take 1 tablet (325 mg  total) by mouth 2 (two) times daily with a meal.   ibuprofen 600 MG tablet Commonly known as:  ADVIL,MOTRIN Take 1 tablet (600 mg total) by mouth every 6 (six) hours.   PRENATAL ADULT GUMMY/DHA/FA PO Take 2 tablets by mouth daily.   senna-docusate 8.6-50 MG tablet Commonly known as:  Senokot-S Take 2 tablets by mouth daily. Start taking on:  07/11/2018            Discharge Care Instructions  (From admission, onward)         Start     Ordered   07/10/18 0000  Discharge wound care:    Comments:  Sitz baths 3-5 times daily; alternate with ice packs   07/10/18 1124         Outpatient follow up:  Follow-up Information    Olivia Mackie, MD. Schedule an appointment as soon as possible for a visit in 6 week(s).   Specialty:  Obstetrics and Gynecology Why:  Postpartum visit Contact information: [address] [place] [address] [phone]           Signed:  Carlean Jews, MSN, CNM [facility]

## 2018-07-10 NOTE — Progress Notes (Addendum)
PPD #1, SVD, 3rd degree repair, baby girl "Blaire"  S:  Reports feeling a little better today; was able to sleep more overnight             Tolerating po/ Vomited x 1 this morning due to not eating / Denies dizziness or SOB             Bleeding is moderate             Pain controlled with Motrin and Tylenol             Up ad lib / ambulatory / voiding QS  Newborn breast feeding with formula supplementation - reports having some pain with nipples.   O:               VS: BP 121/81 (BP Location: Right Arm)   Pulse 85   Temp 98.1 F (36.7 C) (Oral)   Resp 16   Ht 5\' 3"  (1.6 m)   Wt 78.7 kg   SpO2 100%   Breastfeeding? Unknown   BMI 30.73 kg/m    LABS:              Recent Labs    07/08/18 0750 07/09/18 0636  WBC 10.9* 12.9*  HGB 10.8* 8.7*  PLT 226 173               Blood type: --/--/O NEG (10/01 0636)  Rubella: Immune (06/11 0000)                                 Physical Exam:             Alert and oriented X3, mildly flat affect   Lungs: Clear and unlabored  Heart: regular rate and rhythm / no murmurs  Abdomen: soft, non-tender, non-distended              Fundus: firm, non-tender, U-2  Perineum: well approximated 3rd degree; edema improved; no erythema, ecchymosis; no external hemorrhoids noted  Lochia: small, no clots   Extremities: trace pedal edema, no calf pain or tenderness    A: PPD # 2, SVD             3rd degree repair             ABL Anemia compounding IDA - on oral FE             RH Negative - baby RH Positive - s/p Rhogam 10/1             ?Hemorrhoids              Doing well - stable status   P: Routine post partum orders  Discharge home today  WOB discharge book and instructions/warning s/s reviewed   PPD/anxiety s/s reviewed   F/u in 6 weeks for PP visit   Carlean Jews, MSN, CNM [facility]

## 2018-09-02 ENCOUNTER — Emergency Department
Admission: EM | Admit: 2018-09-02 | Discharge: 2018-09-02 | Disposition: A | Discharge status: Home / Self Care | Payer: Commercial Managed Care - PPO | Source: Home / Self Care

## 2018-09-02 ENCOUNTER — Emergency Department: Payer: Commercial Managed Care - PPO

## 2018-09-02 ENCOUNTER — Encounter: Payer: Self-pay | Admitting: Family Medicine

## 2018-09-02 DIAGNOSIS — M79605 Pain in left leg: Secondary | ICD-10-CM

## 2018-09-02 NOTE — ED Triage Notes (Signed)
Pt is 7 weeks post-partum, states a week or so following delivery she began having left posterior knee pain and intermittent cramping of left calf and intermittent dyspnea. Pain is worse at night lying flat. Has had a recent U/S or left leg and was told it was "normal". Denies injury to left leg.

## 2018-09-02 NOTE — Discharge Instructions (Addendum)
Your ultrasound is negative for either Baker's cyst or clots in the veins.  This is a big relief because it suggests that this is not a clot related phenomenon.  Instead, I suspect that sleep deprivation and movement being different with both breast-feeding and take care of the baby are responsible for these new symptoms.  What I would do now is to take Pepcid daily to prevent reflux and ibuprofen 400 mg 3 times a day and expect some resolution of your symptoms over the next 48 hours.  If not, please return for discussion and possible further testing.

## 2018-09-02 NOTE — ED Provider Notes (Signed)
[name]    CSN: [id number] Arrival date & time: 09/02/18  1037     History   Chief Complaint Chief Complaint  Patient presents with  . Leg Pain  . Shortness of Breath    HPI Molly Chase is a 27 y.o. female.   This is an initial visit for this 27 year old woman who has complained of left leg pain and intermittent shortness of breath.  Patient is now 7 weeks postpartum and developed popliteal soreness and tenderness about 2 weeks postpartum.  She also had some intermittent shortness of breath developing at the same time with heaviness in her chest.  Patient was sent for a cardiac ultrasound which was normal.  She is continued to have the same symptoms and feels like she might have a clot in her left leg.  Patient said no redness or swelling in the lower left extremity.  Patient has had asthma but the heaviness in her chest does not feel like asthma.  Patient is slowly recovering from this initial postpartum interval With some sleep deprivation.  Her child is starting to sleep through the night.  She is tapering her breast-feeding.  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     Past Medical History:  Diagnosis Date  . H pylori ulcer   . Ovarian cyst   . PCOS (polycystic ovarian syndrome)     Patient Active Problem List   Diagnosis Date Noted  . Encounter for planned induction of labor 07/08/2018  . SVD 9/30 07/08/2018  . Postpartum care following vaginal delivery 07/08/2018  . Perineal laceration with delivery, third degree 07/08/2018  . Rh negative, maternal 07/08/2018    Past Surgical History:  Procedure Laterality Date  . NO PAST SURGERIES    . UPPER GI ENDOSCOPY      OB History    Gravida  1   Para  1   Term  1   Preterm      AB      Living  1     SAB      TAB      Ectopic      Multiple  0   Live Births  1            Home Medications    Prior to Admission medications   Medication Sig Start  Date End Date Taking? Authorizing Provider  ibuprofen (ADVIL,MOTRIN) 600 MG tablet Take 1 tablet (600 mg total) by mouth every 6 (six) hours. 07/10/18   Sigmon, Meredith C, CNM  senna-docusate (SENOKOT-S) 8.6-50 MG tablet Take 2 tablets by mouth daily. 07/11/18   Karena Addison, CNM    Family History Family History  Problem Relation Age of Onset  . Healthy Mother   . Healthy Father   . Heart disease Maternal Grandmother   . Hypertension Maternal Grandmother   . Diabetes Maternal Grandmother   . Heart attack Maternal Grandfather   . Hypertension Maternal Grandfather   . Diabetes Maternal Grandfather   . Stroke Paternal Grandfather   . Heart disease Paternal Grandfather   . Pulmonary embolism Paternal Grandfather     Social History Social History   Tobacco Use  . Smoking status: Never Smoker  . Smokeless tobacco: Never Used  Substance Use Topics  . Alcohol use: Yes    Comment: not with preg  . Drug use: No     Allergies   Patient has no known allergies.   Review of Systems Review of Systems  Respiratory: Positive for chest tightness and shortness of breath.   Musculoskeletal:       Left lower extremity discomfort  All other systems reviewed and are negative.    Physical Exam Triage Vital Signs ED Triage Vitals  Enc Vitals Group     BP      Pulse      Resp      Temp      Temp src      SpO2      Weight      Height      Head Circumference      Peak Flow      Pain Score      Pain Loc      Pain Edu?      Excl. in GC?    No data found.  Updated Vital Signs BP 123/82 (BP Location: Right Arm)   Pulse 78   Temp 98.3 F (36.8 C) (Oral)   Resp 16   Ht 5\' 3"  (1.6 m)   Wt 68 kg   SpO2 98%   BMI 26.57 kg/m    Physical Exam  Constitutional: She is oriented to person, place, and time. She appears well-developed and well-nourished.  HENT:  Head: Normocephalic and atraumatic.  Mouth/Throat: Oropharynx is clear and moist.  Eyes: Pupils are equal,  round, and reactive to light.  Neck: Normal range of motion.  Cardiovascular: Normal rate and regular rhythm.  Pulmonary/Chest: Effort normal and breath sounds normal.  Musculoskeletal: Normal range of motion.       Left lower leg: She exhibits tenderness. She exhibits no edema.  Patient has tenderness in the left popliteal fossa.  Neurological: She is alert and oriented to person, place, and time.  Psychiatric: She has a normal mood and affect. Her behavior is normal.  Nursing note and vitals reviewed.    UC Treatments / Results  Labs (all labs ordered are listed, but only abnormal results are displayed) Labs Reviewed - No data to display  EKG None  Radiology [place] Venous Img Lower Unilateral Left  Result Date: 09/02/2018 CLINICAL DATA:  LEFT popliteal pain starting 2 weeks postpartum associated with some heaviness in chest, calf cramping EXAM: LEFT LOWER EXTREMITY VENOUS DOPPLER ULTRASOUND TECHNIQUE: Gray-scale sonography with graded compression, as well as color Doppler and duplex ultrasound were performed to evaluate the lower extremity deep venous systems from the level of the common femoral vein and including the common femoral, femoral, profunda femoral, popliteal and calf veins including the posterior tibial, peroneal and gastrocnemius veins when visible. The superficial great saphenous vein was also interrogated. Spectral Doppler was utilized to evaluate flow at rest and with distal augmentation maneuvers in the common femoral, femoral and popliteal veins. COMPARISON:  None FINDINGS: Contralateral Common Femoral Vein: Respiratory phasicity is normal and symmetric with the symptomatic side. No evidence of thrombus. Normal compressibility. Common Femoral Vein: No evidence of thrombus. Normal compressibility, respiratory phasicity and response to augmentation. Saphenofemoral Junction: No evidence of thrombus. Normal compressibility and flow on color Doppler imaging. Profunda Femoral Vein:  No evidence of thrombus. Normal compressibility and flow on color Doppler imaging. Femoral Vein: No evidence of thrombus. Normal compressibility, respiratory phasicity and response to augmentation. Popliteal Vein: No evidence of thrombus. Normal compressibility, respiratory phasicity and response to augmentation. Calf Veins: No evidence of thrombus. Normal compressibility and flow on color Doppler imaging. Superficial Great Saphenous Vein: No evidence of thrombus. Normal compressibility. Venous Reflux:  None. Other Findings:  None. IMPRESSION: No evidence  of deep venous thrombosis in the LEFT lower extremity. Electronically Signed   By: Ulyses Southward M.D.   On: 09/02/2018 12:15    Procedures Procedures (including critical care time)  Medications Ordered in UC Medications - No data to display  Initial Impression / Assessment and Plan / UC Course  I have reviewed the triage vital signs and the nursing notes.  Pertinent labs & imaging results that were available during my care of the patient were reviewed by me and considered in my medical decision making (see chart for details).    Final Clinical Impressions(s) / UC Diagnoses   Final diagnoses:  Left leg pain     Discharge Instructions     Your ultrasound is negative for either Baker's cyst or clots in the veins.  This is a big relief because it suggests that this is not a clot related phenomenon.  Instead, I suspect that sleep deprivation and movement being different with both breast-feeding and take care of the baby are responsible for these new symptoms.  What I would do now is to take Pepcid daily to prevent reflux and ibuprofen 400 mg 3 times a day and expect some resolution of your symptoms over the next 48 hours.  If not, please return for discussion and possible further testing.    ED Prescriptions    None     Controlled Substance Prescriptions [place] Controlled Substance Registry consulted? Not Applicable   Elvina Sidle,  MD 09/02/18 1233

## 2020-02-08 ENCOUNTER — Ambulatory Visit (HOSPITAL_BASED_OUTPATIENT_CLINIC_OR_DEPARTMENT_OTHER)
Admission: RE | Admit: 2020-02-08 | Discharge: 2020-02-08 | Disposition: A | Discharge status: Home / Self Care | Payer: Commercial Managed Care - PPO | Source: Ambulatory Visit | Attending: Family Medicine | Admitting: Family Medicine

## 2020-02-08 ENCOUNTER — Emergency Department
Admission: EM | Admit: 2020-02-08 | Discharge: 2020-02-08 | Disposition: A | Discharge status: Home / Self Care | Payer: Commercial Managed Care - PPO | Source: Home / Self Care

## 2020-02-08 ENCOUNTER — Ambulatory Visit
Admission: RE | Admit: 2020-02-08 | Discharge status: Absent without leave | Payer: Commercial Managed Care - PPO | Source: Ambulatory Visit

## 2020-02-08 ENCOUNTER — Other Ambulatory Visit: Payer: Self-pay

## 2020-02-08 ENCOUNTER — Encounter: Payer: Self-pay | Admitting: Emergency Medicine

## 2020-02-08 DIAGNOSIS — M79652 Pain in left thigh: Secondary | ICD-10-CM | POA: Diagnosis not present

## 2020-02-08 DIAGNOSIS — R252 Cramp and spasm: Secondary | ICD-10-CM

## 2020-02-08 NOTE — ED Triage Notes (Signed)
Pain to left thigh on and off  - worse this week - pain w/ touch  Has an appointment w/ PCP in 2 weeks (Dr Lyn Hollingshead) Hx of pain w/ veins in left leg behind knees after birth of her daughter Pt worried about a blood clot Pt is active stay at home - lifts heavy weights covid vaccine 3 weeks ago -1st Moderna

## 2020-02-08 NOTE — ED Provider Notes (Signed)
Molly [facility]    CSN: [id number] Arrival date & time: 02/08/20  8185      History   Chief Complaint Chief Complaint  Patient presents with  . Leg Pain    HPI Molly Chase is a 29 y.o. female.   HPI  Molly Chase is a 29 y.o. female presenting to UC with c/o Left thigh pain to the medial and posterior thigh intermittently since delivering her daughter in 06/2018.  She was seen for posterior knee pain on 09/02/18 for Left posterior knee pain, found to have a normal venous ultrasound. She has not f/u with a PCP for recurrent leg pain but has a new patient appointment with Dr. Lyn Hollingshead in 2 weeks. Pt reports getting her Moderna Covid-19 vaccine 3 weeks ago and is concerned she may have a clot in her leg due to worsening pain this week. She does lift weights but denies known injury.  She takes ibuprofen PRN with mild relief. Denies redness or swelling but has felt "bulges" in her veins and spasms at times. Denies chest pain or SOB. No prior hx of clots.   Past Medical History:  Diagnosis Date  . H pylori ulcer   . Ovarian cyst   . PCOS (polycystic ovarian syndrome)     Patient Active Problem List   Diagnosis Date Noted  . Encounter for planned induction of labor 07/08/2018  . SVD 9/30 07/08/2018  . Postpartum care following vaginal delivery 07/08/2018  . Perineal laceration with delivery, third degree 07/08/2018  . Rh negative, maternal 07/08/2018    Past Surgical History:  Procedure Laterality Date  . NO PAST SURGERIES    . UPPER GI ENDOSCOPY      OB History    Gravida  1   Para  1   Term  1   Preterm      AB      Living  1     SAB      TAB      Ectopic      Multiple  0   Live Births  1            Home Medications    Prior to Admission medications   Medication Sig Start Date End Date Taking? Authorizing Provider  ibuprofen (ADVIL,MOTRIN) 600 MG tablet Take 1 tablet (600 mg total) by mouth every 6 (six) hours. 07/10/18   Sigmon,  Meredith C, CNM  senna-docusate (SENOKOT-S) 8.6-50 MG tablet Take 2 tablets by mouth daily. 07/11/18   Karena Addison, CNM    Family History Family History  Problem Relation Age of Onset  . Healthy Mother   . Healthy Father   . Heart disease Maternal Grandmother   . Hypertension Maternal Grandmother   . Diabetes Maternal Grandmother   . Heart attack Maternal Grandfather   . Hypertension Maternal Grandfather   . Diabetes Maternal Grandfather   . Stroke Paternal Grandfather   . Heart disease Paternal Grandfather   . Pulmonary embolism Paternal Grandfather     Social History Social History   Tobacco Use  . Smoking status: Never Smoker  . Smokeless tobacco: Never Used  Substance Use Topics  . Alcohol use: Yes    Alcohol/week: 4.0 standard drinks    Types: 4 Standard drinks or equivalent per week  . Drug use: No     Allergies   Patient has no known allergies.   Review of Systems Review of Systems  Respiratory: Negative for shortness of breath.  Cardiovascular: Negative for chest pain and palpitations.  Musculoskeletal: Positive for myalgias. Negative for arthralgias, gait problem and joint swelling.  Skin: Negative for color change and rash.     Physical Exam Triage Vital Signs ED Triage Vitals  Enc Vitals Group     BP 02/08/20 0951 130/89     Pulse Rate 02/08/20 0951 73     Resp 02/08/20 0951 15     Temp 02/08/20 0951 98 F (36.7 C)     Temp Source 02/08/20 0951 Oral     SpO2 02/08/20 0951 98 %     Weight 02/08/20 0953 140 lb (63.5 kg)     Height 02/08/20 0953 5' 3.3" (1.608 m)     Head Circumference --      Peak Flow --      Pain Score 02/08/20 0953 0     Pain Loc --      Pain Edu? --      Excl. in [place]? --    No data found.  Updated Vital Signs BP 130/89   Pulse 73   Temp 98 F (36.7 C) (Oral)   Resp 15   Ht 5' 3.3" (1.608 m)   Wt 140 lb (63.5 kg)   LMP 01/26/2020 (Exact Date)   SpO2 98%   Breastfeeding No   BMI 24.57 kg/m   Visual  Acuity Right Eye Distance:   Left Eye Distance:   Bilateral Distance:    Right Eye Near:   Left Eye Near:    Bilateral Near:     Physical Exam Vitals and nursing note reviewed.  Constitutional:      Appearance: Normal appearance. She is well-developed.  HENT:     Head: Normocephalic and atraumatic.  Cardiovascular:     Rate and Rhythm: Normal rate.  Pulmonary:     Effort: Pulmonary effort is normal.  Musculoskeletal:        General: Tenderness present. No swelling. Normal range of motion.     Cervical back: Normal range of motion.       Legs:     Comments: Calf is soft, non-tender. Full ROM Left hip and knee. No bony tenderness.   Skin:    General: Skin is warm and dry.     Findings: No bruising or erythema.  Neurological:     Mental Status: She is alert and oriented to person, place, and time.  Psychiatric:        Behavior: Behavior normal.      UC Treatments / Results  Labs (all labs ordered are listed, but only abnormal results are displayed) Labs Reviewed - No data to display  EKG   Radiology US Venous Img Lower Unilateral Left (DVT)  Result Date: 02/08/2020 CLINICAL DATA:  Left posteromedial thigh pain 1 week. EXAM: LEFT LOWER EXTREMITY VENOUS DOPPLER ULTRASOUND TECHNIQUE: Gray-scale sonography with compression, as well as color and duplex ultrasound, were performed to evaluate the deep venous system(s) from the level of the common femoral vein through the popliteal and proximal calf veins. COMPARISON:  None. FINDINGS: VENOUS Normal compressibility of the common femoral, superficial femoral, and popliteal veins, as well as the visualized calf veins. Visualized portions of profunda femoral vein and great saphenous vein unremarkable. No filling defects to suggest DVT on grayscale or color Doppler imaging. Doppler waveforms show normal direction of venous flow, normal respiratory plasticity and response to augmentation. Limited views of the contralateral common femoral  vein are unremarkable. OTHER None. Limitations: none IMPRESSION: Negative. Electronically Signed  By: Elberta Fortis M.D.   On: 02/08/2020 11:31    Procedures Procedures (including critical care time)  Medications Ordered in UC Medications - No data to display  Initial Impression / Assessment and Plan / UC Course  I have reviewed the triage vital signs and the nursing notes.  Pertinent labs & imaging results that were available during my care of the patient were reviewed by me and considered in my medical decision making (see chart for details).    Pt sent to [facility] for imaging to r/o DVT Hx and exam c/w  Muscle spasms and strains, likely from pt's reported weight lifting. Encouraged good stretching and to stay well hydrated. Encouraged to f/u with PCP as planned.  AVS provided.  Final Clinical Impressions(s) / UC Diagnoses   Final diagnoses:  Leg cramps  Acute pain of left thigh     Discharge Instructions      An ultrasound is needed of your leg to rule out a blood clot.  You are scheduled for an ultrasound this morning at [facility] off [address].  Please enter and register through the emergency department, let them know you are there for OUT PATIENT IMAGING and they will direct you to the ultrasound department.  You can stay in the waiting room there to go over the results over the phone but if you have to leave before the results are back, make sure you leave a good call back number in case results are abnormal and you need to be notified.  Be sure to keep your follow up appointment with Dr. Lyn Hollingshead to discuss today's visit as well.     ED Prescriptions    None     PDMP not reviewed this encounter.   Lurene Shadow, [place] 02/08/20 1146

## 2020-02-08 NOTE — Discharge Instructions (Signed)
  An ultrasound is needed of your leg to rule out a blood clot.  You are scheduled for an ultrasound this morning at [facility] off [address].  Please enter and register through the emergency department, let them know you are there for OUT PATIENT IMAGING and they will direct you to the ultrasound department.  You can stay in the waiting room there to go over the results over the phone but if you have to leave before the results are back, make sure you leave a good call back number in case results are abnormal and you need to be notified.  Be sure to keep your follow up appointment with Dr. Lyn Hollingshead to discuss today's visit as well.

## 2020-02-19 ENCOUNTER — Encounter: Payer: Self-pay | Admitting: Osteopathic Medicine

## 2020-02-19 ENCOUNTER — Ambulatory Visit (INDEPENDENT_AMBULATORY_CARE_PROVIDER_SITE_OTHER): Payer: Commercial Managed Care - PPO | Admitting: Osteopathic Medicine

## 2020-02-19 ENCOUNTER — Other Ambulatory Visit: Payer: Self-pay

## 2020-02-19 VITALS — BP 113/76 | HR 71 | Temp 98.3°F | Ht 63.0 in | Wt 145.0 lb

## 2020-02-19 DIAGNOSIS — M79605 Pain in left leg: Secondary | ICD-10-CM | POA: Diagnosis not present

## 2020-02-19 DIAGNOSIS — R6889 Other general symptoms and signs: Secondary | ICD-10-CM | POA: Diagnosis not present

## 2020-02-19 DIAGNOSIS — Z8759 Personal history of other complications of pregnancy, childbirth and the puerperium: Secondary | ICD-10-CM | POA: Diagnosis not present

## 2020-02-19 DIAGNOSIS — R5382 Chronic fatigue, unspecified: Secondary | ICD-10-CM

## 2020-02-19 DIAGNOSIS — Z0000 Encounter for general adult medical examination without abnormal findings: Secondary | ICD-10-CM

## 2020-02-19 NOTE — Progress Notes (Signed)
Molly Chase is a 29 y.o. female who presents to  [place] at [facility]  today, 02/19/20, seeking care for the following: . New to establish   Experiences chills/fatigue around ovulation Urinary incontinence s/p SVD - seeing pelvic PT  Difficulty losing weight and (+)cold intolerance - attributes to hormones  Hx PIH -  On no Rx Currently using condoms for contraception  Nonsmoker  Recurrent L leg pain seen in UC 02/08/20 in thigh - medial/posteroir, was concerned about blood clot given recent COVID vax at the time, [place] was (-) for DVT  Likes to hike and go to breweries with her husband Young daughter, grandparents live close and help w/ care     ASSESSMENT & PLAN with other pertinent history/findings:  The primary encounter diagnosis was Annual physical exam. Diagnoses of History of gestational hypertension, Leg pain, medial, left, Cold intolerance, and Chronic fatigue were also pertinent to this visit.  BP Readings from Last 3 Encounters:  02/19/20 113/76  02/08/20 130/89  09/02/18 123/82      Patient Instructions  General Preventive Care  Most recent routine screening labs: ordered today .   Everyone should have blood pressure checked once per year. Goal 130/80 or less.   Tobacco: don't!   Alcohol: responsible moderation is ok for most adults - if you have concerns about your alcohol intake, please talk to me!   Exercise: as tolerated to reduce risk of cardiovascular disease and diabetes. Strength training will also prevent osteoporosis.   Mental health: if need for mental health care (medicines, counseling, other), or concerns about moods, please let me know!   Sexual health: if need for STD testing, or if concerns with libido/pain problems, please let me know! If you need to discuss your birth control options, please let me know!   Advanced Directive: Living Will and/or Healthcare Power of Attorney recommended for all  adults, regardless of age or health.  Vaccines  Flu vaccine: for almost everyone, every fall.   Shingles vaccine: after age 33.   Pneumonia vaccines: after age 38, or sooner if certain medical conditions.  Tetanus booster: every 10 years / 3rd trimester of pregnancy  HPV vaccine: Gardasil up to age 42 to prevent HPV-associated diseases, including certain cancers.   COVID vaccine: THANKS! Cancer screenings   Colon cancer screening: for everyone age 37-75. Colonoscopy available for all, many people also qualify for the Cologuard stool test   Breast cancer screening: mammogram at age 48 every other year at least, and annually after age 41.   Cervical cancer screening: Pap every 1 to 5 years depending on age and other risk factors. Can usually stop at age 52 or w/ hysterectomy.   Lung cancer screening: not needed for non-smokers  Infection screenings  . HIV: recommended screening at least once age 89-65, more often as needed. . Gonorrhea/Chlamydia: screening as needed . Hepatitis C: recommended once for everyone age 82-75 . TB: certain at-risk populations, or depending on work requirements and/or travel history Other . Bone Density Test: recommended for women at age 40         Orders Placed This Encounter  Procedures  . CBC  . COMPLETE METABOLIC PANEL WITH GFR  . Lipid panel  . TSH    No orders of the defined types were placed in this encounter.   Constitutional:  . VSS, see nurse notes . General Appearance: alert, well-developed, well-nourished, NAD Eyes: Marland Kitchen Normal lids and conjunctive, non-icteric sclera .  PERRLA Ears, Nose, Mouth, Throat: . Normal appearance Neck: . No masses, trachea midline . No thyroid enlargement/tenderness/mass appreciated Respiratory: . Normal respiratory effort . Breath sounds normal, no wheeze/rhonchi/rales Cardiovascular: . S1/S2 normal, no murmur/rub/gallop auscultated . No lower extremity edema Gastrointestinal: . Nontender,  no masses . No hepatomegaly, no splenomegaly . No hernia appreciated Musculoskeletal:  . Gait normal . No clubbing/cyanosis of digits Neurological: . No cranial nerve deficit on limited exam . Motor and sensation intact and symmetric Psychiatric: . Normal judgment/insight . Normal mood and affect    Follow-up instructions: Return in about 1 year (around 02/18/2021) for [place] (call week prior to visit for lab orders).                                         BP 113/76 (BP Location: Left Arm, Patient Position: Sitting, Cuff Size: Normal)   Pulse 71   Temp 98.3 F (36.8 C) (Oral)   Ht 5\' 3"  (1.6 m)   Wt 145 lb (65.8 kg)   LMP 01/26/2020 (Exact Date)   BMI 25.69 kg/m   No outpatient medications have been marked as taking for the 02/19/20 encounter (Office Visit) with 02/21/20, DO.    No results found for this or any previous visit (from the past 72 hour(s)).  No results found.  Depression screen [facility] 2/9 02/19/2020  Decreased Interest 0  Down, Depressed, Hopeless 0  PHQ - 2 Score 0  Altered sleeping 0  Tired, decreased energy 1  Change in appetite 0  Feeling bad or failure about yourself  0  Trouble concentrating 0  Moving slowly or fidgety/restless 0  Suicidal thoughts 0  PHQ-9 Score 1  Difficult doing work/chores Not difficult at all    GAD 7 : Generalized Anxiety Score 02/19/2020  Nervous, Anxious, on Edge 1  Control/stop worrying 0  Worry too much - different things 1  Trouble relaxing 1  Restless 0  Easily annoyed or irritable 1  Afraid - awful might happen 0  Total GAD 7 Score 4  Anxiety Difficulty Not difficult at all      All questions at time of visit were answered - patient instructed to contact office with any additional concerns or updates.  ER/RTC precautions were reviewed with the patient.  Please note: voice recognition software was used to produce this document, and typos may escape review. Please  contact Dr. 02/21/2020 for any needed clarifications.

## 2020-02-19 NOTE — Patient Instructions (Signed)
General Preventive Care  Most recent routine screening labs: ordered today .   Everyone should have blood pressure checked once per year. Goal 130/80 or less.   Tobacco: don't!   Alcohol: responsible moderation is ok for most adults - if you have concerns about your alcohol intake, please talk to me!   Exercise: as tolerated to reduce risk of cardiovascular disease and diabetes. Strength training will also prevent osteoporosis.   Mental health: if need for mental health care (medicines, counseling, other), or concerns about moods, please let me know!   Sexual health: if need for STD testing, or if concerns with libido/pain problems, please let me know! If you need to discuss your birth control options, please let me know!   Advanced Directive: Living Will and/or Healthcare Power of Attorney recommended for all adults, regardless of age or health.  Vaccines  Flu vaccine: for almost everyone, every fall.   Shingles vaccine: after age 56.   Pneumonia vaccines: after age 11, or sooner if certain medical conditions.  Tetanus booster: every 10 years / 3rd trimester of pregnancy  HPV vaccine: Gardasil up to age 85 to prevent HPV-associated diseases, including certain cancers.   COVID vaccine: THANKS! Cancer screenings   Colon cancer screening: for everyone age 41-75. Colonoscopy available for all, many people also qualify for the Cologuard stool test   Breast cancer screening: mammogram at age 69 every other year at least, and annually after age 67.   Cervical cancer screening: Pap every 1 to 5 years depending on age and other risk factors. Can usually stop at age 28 or w/ hysterectomy.   Lung cancer screening: not needed for non-smokers  Infection screenings  . HIV: recommended screening at least once age 20-65, more often as needed. . Gonorrhea/Chlamydia: screening as needed . Hepatitis C: recommended once for everyone age 88-75 . TB: certain at-risk populations, or depending on  work requirements and/or travel history Other . Bone Density Test: recommended for women at age 49

## 2020-02-24 ENCOUNTER — Encounter: Payer: Self-pay | Admitting: Osteopathic Medicine

## 2020-02-24 LAB — COMPLETE METABOLIC PANEL WITH GFR
AG Ratio: 1.8 (calc) (ref 1.0–2.5)
ALT: 10 U/L (ref 6–29)
AST: 16 U/L (ref 10–30)
Albumin: 4.3 g/dL (ref 3.6–5.1)
Alkaline phosphatase (APISO): 56 U/L (ref 31–125)
BUN: 13 mg/dL (ref 7–25)
CO2: 28 mmol/L (ref 20–32)
Calcium: 9.4 mg/dL (ref 8.6–10.2)
Chloride: 105 mmol/L (ref 98–110)
Creat: 0.65 mg/dL (ref 0.50–1.10)
GFR, Est African American: 140 mL/min/{1.73_m2} (ref >=60)
GFR, Est Non African American: 121 mL/min/{1.73_m2} (ref >=60)
Globulin: 2.4 g/dL (calc) (ref 1.9–3.7)
Glucose, Bld: 102 mg/dL — ABNORMAL HIGH (ref 65–99)
Potassium: 5.1 mmol/L (ref 3.5–5.3)
Sodium: 139 mmol/L (ref 135–146)
Total Bilirubin: 0.7 mg/dL (ref 0.2–1.2)
Total Protein: 6.7 g/dL (ref 6.1–8.1)

## 2020-02-24 LAB — LIPID PANEL
Cholesterol: 182 mg/dL (ref <200)
HDL: 68 mg/dL (ref >=50)
LDL Cholesterol (Calc): 97 mg/dL (calc)
Non-HDL Cholesterol (Calc): 114 mg/dL (calc) (ref <130)
Total CHOL/HDL Ratio: 2.7 (calc) (ref <5.0)
Triglycerides: 76 mg/dL (ref <150)

## 2020-02-24 LAB — CBC
HCT: 37.3 % (ref 35.0–45.0)
Hemoglobin: 12.6 g/dL (ref 11.7–15.5)
MCH: 30.1 pg (ref 27.0–33.0)
MCHC: 33.8 g/dL (ref 32.0–36.0)
MCV: 89.2 fL (ref 80.0–100.0)
MPV: 9.6 fL (ref 7.5–12.5)
Platelets: 274 10*3/uL (ref 140–400)
RBC: 4.18 10*6/uL (ref 3.80–5.10)
RDW: 11.3 % (ref 11.0–15.0)
WBC: 5.2 10*3/uL (ref 3.8–10.8)

## 2020-02-24 LAB — TSH: TSH: 2.22 mIU/L

## 2020-02-24 NOTE — Telephone Encounter (Signed)
Routing to provider for recommendations

## 2020-04-03 ENCOUNTER — Encounter: Payer: Self-pay | Admitting: Osteopathic Medicine

## 2020-07-13 ENCOUNTER — Encounter: Payer: Self-pay | Admitting: Osteopathic Medicine

## 2020-07-14 NOTE — Progress Notes (Signed)
Virtual Visit via Video Note  I connected with Molly Chase on 07/15/20 at 11:10 AM EDT by a video enabled telemedicine application and verified that I am speaking with the correct person using two identifiers.   I discussed the limitations of evaluation and management by telemedicine and the availability of in person appointments. The patient expressed understanding and agreed to proceed.  Patient location: home Provider locations: office  Subjective:    CC: right arm pain/weakness after flu shot  HPI: Pleasant 29 year old female presenting via MyChart video visit with reports of right arm pain after receiving her flu shot on 10/4.  Notes that she received her vaccine at the [facility] in [place].  At the time of the vaccination, she felt a pain that shot down all the way to her right hand.  Since then she has had pain in the arm with some paresthesias and weakness.  Feels as if her right arm and hand moves slower and have slower reactions.  She does have tenderness on the back of her deltoid just ventral to the injection site.  There is no swelling/redness to the injection site or the rest of her arm.  Rates her arm pain at 2-3/10 and notes that it is more annoying than anything else.  She has been taking ibuprofen and Tylenol which do help with the pain but do not help with the weakness or the slow reactions.  Notes that washing dishes, doing her hair, and other activities are a bit difficult at this time.  Denies fevers, chills, and shortness of breath.  Past medical history, Surgical history, Family history not pertinant except as noted below, Social history, Allergies, and medications have been entered into the medical record, reviewed, and corrections made.   Review of Systems: See HPI for pertinent positives and negatives.   Objective:    General: Speaking clearly in complete sentences without any shortness of breath.  Alert and oriented x3.  Normal judgment. No apparent acute  distress.  Impression and Recommendations:    1. Right arm weakness/pain Suspect this is likely a reaction to the flu vaccination.  There may be trauma from the the needle itself or possibly inflammation involving the nerve.  Without physical exam, difficult to test degree of weakness.  Okay to continue ibuprofen and Tylenol as needed.  May consider doing hot/cold compresses as tolerated.  Recommend MRI of the right upper arm to evaluate for possible trauma or tendon/muscle damage.  Patient would like to continue conservative treatment for 1 to 2 weeks to see if symptoms improve or resolve spontaneously.  If they have not improved by the or if they continue to worsen, she is agreeable to the MRI.  Ordering the procedure today to facilitate insurance approval and scheduling - MR HUMERUS RIGHT W WO CONTRAST; Future  Return if symptoms worsen or fail to improve.  20 minutes of non face-to-face time was provided during this encounter.  I discussed the assessment and treatment plan with the patient. The patient was provided an opportunity to ask questions and all were answered. The patient agreed with the plan and demonstrated an understanding of the instructions.   The patient was advised to call back or seek an in-person evaluation if the symptoms worsen or if the condition fails to improve as anticipated.  Thayer Ohm, DNP, APRN, FNP-BC [facility]

## 2020-07-15 ENCOUNTER — Telehealth (INDEPENDENT_AMBULATORY_CARE_PROVIDER_SITE_OTHER): Payer: Commercial Managed Care - PPO | Admitting: Medical-Surgical

## 2020-07-15 ENCOUNTER — Encounter: Payer: Self-pay | Admitting: Medical-Surgical

## 2020-07-15 DIAGNOSIS — R29898 Other symptoms and signs involving the musculoskeletal system: Secondary | ICD-10-CM

## 2020-07-15 DIAGNOSIS — M79601 Pain in right arm: Secondary | ICD-10-CM | POA: Diagnosis not present

## 2020-07-26 ENCOUNTER — Encounter: Payer: Self-pay | Admitting: Medical-Surgical

## 2020-07-26 ENCOUNTER — Other Ambulatory Visit: Payer: Self-pay

## 2020-07-26 ENCOUNTER — Ambulatory Visit (INDEPENDENT_AMBULATORY_CARE_PROVIDER_SITE_OTHER): Payer: Commercial Managed Care - PPO

## 2020-07-26 DIAGNOSIS — R29898 Other symptoms and signs involving the musculoskeletal system: Secondary | ICD-10-CM

## 2020-07-26 DIAGNOSIS — M79601 Pain in right arm: Secondary | ICD-10-CM | POA: Diagnosis not present

## 2020-07-26 MED ORDER — GADOBUTROL 1 MMOL/ML IV SOLN
6.5000 mL | Freq: Once | INTRAVENOUS | Status: AC | PRN
  Administered 2020-07-26: 6.5 mL via INTRAVENOUS

## 2021-01-10 ENCOUNTER — Encounter: Payer: Self-pay | Admitting: Osteopathic Medicine

## 2021-02-09 ENCOUNTER — Ambulatory Visit (INDEPENDENT_AMBULATORY_CARE_PROVIDER_SITE_OTHER): Payer: Commercial Managed Care - PPO | Admitting: Osteopathic Medicine

## 2021-02-09 ENCOUNTER — Encounter: Payer: Self-pay | Admitting: Osteopathic Medicine

## 2021-02-09 ENCOUNTER — Other Ambulatory Visit: Payer: Self-pay

## 2021-02-09 VITALS — BP 131/75 | HR 70 | Temp 99.1°F | Wt 141.1 lb

## 2021-02-09 DIAGNOSIS — Z7185 Encounter for immunization safety counseling: Secondary | ICD-10-CM | POA: Diagnosis not present

## 2021-02-09 NOTE — Progress Notes (Signed)
Molly Chase is a 30 y.o. female who presents to  [facility]  today, 02/09/21, seeking care for the following:  Marland Kitchen. Vaccine counseling - OBGYN recommended varicella vaccination, pt has some questions about risk vs benefit of this, concern about getting sick / getting other sick w/ the live vaccine      ASSESSMENT & PLAN with other pertinent findings:  The encounter diagnosis was Vaccine counseling.   Discussed exceedingly low risk of getting sick from the vaccine vs higher but generally low risk of getting chickenpox, however potentially significant risk to fetus if chickenpox in pregnancy. I advised it's her decision but I recommend vaccination.   Patient Instructions  Transmission of Vaccine Virus (from CDC) It is rare for vaccinated people to spread varicella vaccine virus, especially if they do not have rash. Worldwide, since the varicella vaccine programs started, only 11 healthy vaccinated people (6 with varicella-like rash and 5 with herpes zoster postvaccination) have been documented as spreading vaccine virus to others. All of these vaccinated people had rash after vaccination. As a result, 13 people, including household members and people in long-term care facilities, got infected with vaccine virus varicella. One additional case had a mechanism other than direct transmission from a vaccine recipient, possibly exposure to vaccine aerosol during preparation of the vaccine for administration.   Dosing in patient with no evidence of immunity to varicella (from ACIP): 2-dose series 4-8 weeks apart if previously did not receive varicella-containing vaccine (VAR or MMRV [measles-mumps-rubella-varicella vaccine] for children); if previously received 1 dose varicella-containing vaccine, 1 dose at least 4 weeks after first dose      Varicella-Zoster Virus Vaccine Live injection What is this medicine? VARICELLA VACCINE (var uh SEL  uh vak SEEN) is a vaccine used to reduce the risk of getting chickenpox. This vaccine does not treat chickenpox. This medicine may be used for other purposes; ask your health care provider or pharmacist if you have questions. COMMON BRAND NAME(S): Varivax, Zostavax What should I tell my health care provider before I take this medicine? They need to know if you have any of the following conditions:  cancer  fever or infection  immune system problems  infection such as tuberculosis  an unusual or allergic reaction to Varicella-Zoster virus vaccine, other medicines, foods, dyes, or preservatives  pregnant or trying to get pregnant  breast-feeding How should I use this medicine? This vaccine is injected under the skin. It is given by a health care provider. A copy of Vaccine Information Statements will be given before each vaccination. Be sure to read this information carefully each time. This sheet may change often. Talk to your health care provider about the use of this vaccine in children. While it may be given to children as young as 4912 months of age for selected conditions, precautions do apply. Overdosage: If you think you have taken too much of this medicine contact a poison control center or emergency room at once. NOTE: This medicine is only for you. Do not share this medicine with others. What if I miss a dose? Keep appointments for follow-up (booster) doses. It is important not to miss your dose. Call your health care provider if you are unable to keep an appointment. What may interact with this medicine? Do not take this medicine with any of the following medications:  medicines that lower your chance of fighting infection  medicines to treat cancer This medicine may also interact with the  following medications:  aspirin and aspirin-like medicines  blood transfusions  immunoglobulins  steroid medicines like prednisone or cortisone This list may not describe all  possible interactions. Give your health care provider a list of all the medicines, herbs, non-prescription drugs, or dietary supplements you use. Also tell them if you smoke, drink alcohol, or use illegal drugs. Some items may interact with your medicine. What should I watch for while using this medicine? Visit your health care provider regularly. This vaccine, like all vaccines, may not fully protect everyone. Do not become pregnant for 3 months after getting this vaccine. Women should inform their health care provider if they wish to become pregnant or think they might be pregnant. There is potential for serious harm to an unborn child. Talk to your health care provider for more information. After getting this vaccine, it may be possible to give chickenpox to others. For 6 weeks, avoid people with low immune systems. Also avoid pregnant women who have not had chickenpox and newborns of mothers who have not had chickenpox. Avoid any newborn baby that was born at less than 28 weeks of pregnancy. Talk to your health care provider if questions. What side effects may I notice from receiving this medicine? Side effects that you should report to your doctor or health care professional as soon as possible:  allergic reactions (skin rash, itching or hives; swelling of the face, lips, or tongue)  change in emotions or moods  fever over 102 degrees F  redness, blistering, peeling, or loosening of the skin, including inside the mouth  seizures  trouble breathing  unusually weak or tired Side effects that usually do not require medical attention (report to your doctor or health care professional if they continue or are bothersome):  chickenpox-like rash  diarrhea  headache  low-grade fever under 102 degrees F  loss of appetite  nausea  pain, redness, or irritation at site where injected  trouble sleeping  vomiting This list may not describe all possible side effects. Call your doctor  for medical advice about side effects. You may report side effects to FDA at 1-800-FDA-1088. Where should I keep my medicine? This vaccine is only given by a health care provider. It will not be stored at home. NOTE: This sheet is a summary. It may not cover all possible information. If you have questions about this medicine, talk to your doctor, pharmacist, or health care provider.  2021 Elsevier/Gold Standard (2019-10-31 13:24:51)      What are the risks associated with chickenpox and pregnancy? [facility] patient educational information, Corliss Skains, M.D.  If you are pregnant and have chickenpox (varicella) -- a highly contagious viral infection that causes an itchy, blister-like rash -- you and your baby might face serious health risks.  If you develop chickenpox during pregnancy, you are at risk of complications such as pneumonia. For your baby, the risks depend on the timing. If chickenpox develops during the first 20 weeks of pregnancy -- particularly between weeks eight and 20 -- the baby faces a slight risk of a rare group of serious birth defects known as congenital varicella syndrome. A baby who has congenital varicella syndrome might develop skin scarring, and eye, brain, limb and gastrointestinal abnormalities. If chickenpox develops during the few days before you deliver to 48 hours postpartum, the baby might be born with a potentially life-threatening infection called neonatal varicella.  If you're exposed to chickenpox during pregnancy and you're not immune, contact your health care provider immediately.  He or she might recommend an injection of an immune globulin product that contains antibodies to the chickenpox virus. When given within 10 days of exposure, the immune globulin can reduce the risk of chickenpox or reduce its severity. Unfortunately, due to the rareness of congenital varicella syndrome, it isn't clear if this treatment helps protect the developing baby.  If  you develop chickenpox during pregnancy, your health care provider might prescribe oral antiviral drugs to speed your recovery. The medication is most effective when given within 24 hours of the rash developing. If you have chickenpox when you deliver, your baby might be treated with an immune globulin product shortly after birth to try to prevent neonatal varicella. If your baby develops chickenpox in the first two weeks of life, antiviral drugs might be given as well.  If you're considering pregnancy and you haven't already had chickenpox or been vaccinated, ask your health care provider about the chickenpox vaccine. It's safe for adults, but it's recommended that you wait until three months after your second dose of the vaccine before trying to conceive. If you're not sure whether you're immune, your health care provider can do a blood test to find out if you're immune or have already had the vaccine.    No orders of the defined types were placed in this encounter.   No orders of the defined types were placed in this encounter.    See below for relevant physical exam findings  See below for recent lab and imaging results reviewed  Medications, allergies, PMH, PSH, SocH, FamH reviewed below    Follow-up instructions: No follow-ups on file.                                        Exam:  BP 131/75 (BP Location: Left Arm, Patient Position: Sitting, Cuff Size: Normal)   Pulse 70   Temp 99.1 F (37.3 C) (Oral)   Wt 141 lb 1.9 oz (64 kg)   BMI 25.00 kg/m   Constitutional: VS see above. General Appearance: alert, well-developed, well-nourished, NAD  Neck: No masses, trachea midline.   Respiratory: Normal respiratory effort.   Musculoskeletal: Gait normal. Symmetric and independent movement of all extremities  Neurological: Normal balance/coordination. No tremor.  Skin: warm, dry, intact.   Psychiatric: Normal judgment/insight. Normal mood and  affect. Oriented x3.   Current Meds  Medication Sig  . acetaminophen (TYLENOL) 500 MG tablet Take 500 mg by mouth every 6 (six) hours as needed.  Marland Kitchen ibuprofen (ADVIL) 200 MG tablet Take 200 mg by mouth every 6 (six) hours as needed.    No Known Allergies  Patient Active Problem List   Diagnosis Date Noted  . Encounter for planned induction of labor 07/08/2018  . SVD 9/30 07/08/2018  . Postpartum care following vaginal delivery 07/08/2018  . Perineal laceration with delivery, third degree 07/08/2018  . Rh negative, maternal 07/08/2018    Family History  Problem Relation Age of Onset  . Healthy Mother   . Healthy Father   . High blood pressure Father   . Heart disease Maternal Grandmother   . Hypertension Maternal Grandmother   . Diabetes Maternal Grandmother   . Heart attack Maternal Grandmother   . High blood pressure Maternal Grandmother   . Heart attack Maternal Grandfather   . Hypertension Maternal Grandfather   . Diabetes Maternal Grandfather   . Stroke Paternal Grandfather   .  Heart disease Paternal Grandfather   . Pulmonary embolism Paternal Grandfather   . Skin cancer Paternal Grandfather   . Heart attack Paternal Grandfather     Social History   Tobacco Use  Smoking Status Never Smoker  Smokeless Tobacco Never Used    Past Surgical History:  Procedure Laterality Date  . NO PAST SURGERIES    . UPPER GI ENDOSCOPY      Immunization History  Administered Date(s) Administered  . Influenza, Seasonal, Injecte, Preservative Fre 09/24/2015  . Influenza-Unspecified 07/12/2020  . Moderna Sars-Covid-2 Vaccination 01/24/2020, 02/28/2020  . Tdap 09/06/2015, 07/08/2018    No results found for this or any previous visit (from the past 2160 hour(s)).  No results found.     All questions at time of visit were answered - patient instructed to contact office with any additional concerns or updates. ER/RTC precautions were reviewed with the patient as applicable.    Please note: manual typing as well as voice recognition software may have been used to produce this document - typos may escape review. Please contact Dr. Lyn Hollingshead for any needed clarifications.   Total encounter time on date of service, 02/10/21, was 30 minutes spent addressing problems/issues as noted above in Assessment & Plan, including time spent in discussion with patient regarding the HPI, ROS, confirming history, reviewing Assessment & Plan, as well as time spent on coordination of care, record review.

## 2021-02-09 NOTE — Patient Instructions (Addendum)
Transmission of Vaccine Virus (from CDC) It is rare for vaccinated people to spread varicella vaccine virus, especially if they do not have rash. Worldwide, since the varicella vaccine programs started, only 11 healthy vaccinated people (6 with varicella-like rash and 5 with herpes zoster postvaccination) have been documented as spreading vaccine virus to others. All of these vaccinated people had rash after vaccination. As a result, 13 people, including household members and people in long-term care facilities, got infected with vaccine virus varicella. One additional case had a mechanism other than direct transmission from a vaccine recipient, possibly exposure to vaccine aerosol during preparation of the vaccine for administration.   Dosing in patient with no evidence of immunity to varicella (from ACIP): 2-dose series 4-8 weeks apart if previously did not receive varicella-containing vaccine (VAR or MMRV [measles-mumps-rubella-varicella vaccine] for children); if previously received 1 dose varicella-containing vaccine, 1 dose at least 4 weeks after first dose      Varicella-Zoster Virus Vaccine Live injection What is this medicine? VARICELLA VACCINE (var uh SEL uh vak SEEN) is a vaccine used to reduce the risk of getting chickenpox. This vaccine does not treat chickenpox. This medicine may be used for other purposes; ask your health care provider or pharmacist if you have questions. COMMON BRAND NAME(S): Varivax, Zostavax What should I tell my health care provider before I take this medicine? They need to know if you have any of the following conditions:  cancer  fever or infection  immune system problems  infection such as tuberculosis  an unusual or allergic reaction to Varicella-Zoster virus vaccine, other medicines, foods, dyes, or preservatives  pregnant or trying to get pregnant  breast-feeding How should I use this medicine? This vaccine is injected under the skin. It is  given by a health care provider. A copy of Vaccine Information Statements will be given before each vaccination. Be sure to read this information carefully each time. This sheet may change often. Talk to your health care provider about the use of this vaccine in children. While it may be given to children as young as 26 months of age for selected conditions, precautions do apply. Overdosage: If you think you have taken too much of this medicine contact a poison control center or emergency room at once. NOTE: This medicine is only for you. Do not share this medicine with others. What if I miss a dose? Keep appointments for follow-up (booster) doses. It is important not to miss your dose. Call your health care provider if you are unable to keep an appointment. What may interact with this medicine? Do not take this medicine with any of the following medications:  medicines that lower your chance of fighting infection  medicines to treat cancer This medicine may also interact with the following medications:  aspirin and aspirin-like medicines  blood transfusions  immunoglobulins  steroid medicines like prednisone or cortisone This list may not describe all possible interactions. Give your health care provider a list of all the medicines, herbs, non-prescription drugs, or dietary supplements you use. Also tell them if you smoke, drink alcohol, or use illegal drugs. Some items may interact with your medicine. What should I watch for while using this medicine? Visit your health care provider regularly. This vaccine, like all vaccines, may not fully protect everyone. Do not become pregnant for 3 months after getting this vaccine. Women should inform their health care provider if they wish to become pregnant or think they might be pregnant. There is potential for  serious harm to an unborn child. Talk to your health care provider for more information. After getting this vaccine, it may be possible  to give chickenpox to others. For 6 weeks, avoid people with low immune systems. Also avoid pregnant women who have not had chickenpox and newborns of mothers who have not had chickenpox. Avoid any newborn baby that was born at less than 28 weeks of pregnancy. Talk to your health care provider if questions. What side effects may I notice from receiving this medicine? Side effects that you should report to your doctor or health care professional as soon as possible:  allergic reactions (skin rash, itching or hives; swelling of the face, lips, or tongue)  change in emotions or moods  fever over 102 degrees F  redness, blistering, peeling, or loosening of the skin, including inside the mouth  seizures  trouble breathing  unusually weak or tired Side effects that usually do not require medical attention (report to your doctor or health care professional if they continue or are bothersome):  chickenpox-like rash  diarrhea  headache  low-grade fever under 102 degrees F  loss of appetite  nausea  pain, redness, or irritation at site where injected  trouble sleeping  vomiting This list may not describe all possible side effects. Call your doctor for medical advice about side effects. You may report side effects to FDA at 1-800-FDA-1088. Where should I keep my medicine? This vaccine is only given by a health care provider. It will not be stored at home. NOTE: This sheet is a summary. It may not cover all possible information. If you have questions about this medicine, talk to your doctor, pharmacist, or health care provider.  2021 Elsevier/Gold Standard (2019-10-31 13:24:51)      What are the risks associated with chickenpox and pregnancy? [facility] patient educational information, Corliss Skains, M.D.  If you are pregnant and have chickenpox (varicella) -- a highly contagious viral infection that causes an itchy, blister-like rash -- you and your baby might face serious  health risks.  If you develop chickenpox during pregnancy, you are at risk of complications such as pneumonia. For your baby, the risks depend on the timing. If chickenpox develops during the first 20 weeks of pregnancy -- particularly between weeks eight and 20 -- the baby faces a slight risk of a rare group of serious birth defects known as congenital varicella syndrome. A baby who has congenital varicella syndrome might develop skin scarring, and eye, brain, limb and gastrointestinal abnormalities. If chickenpox develops during the few days before you deliver to 48 hours postpartum, the baby might be born with a potentially life-threatening infection called neonatal varicella.  If you're exposed to chickenpox during pregnancy and you're not immune, contact your health care provider immediately. He or she might recommend an injection of an immune globulin product that contains antibodies to the chickenpox virus. When given within 10 days of exposure, the immune globulin can reduce the risk of chickenpox or reduce its severity. Unfortunately, due to the rareness of congenital varicella syndrome, it isn't clear if this treatment helps protect the developing baby.  If you develop chickenpox during pregnancy, your health care provider might prescribe oral antiviral drugs to speed your recovery. The medication is most effective when given within 24 hours of the rash developing. If you have chickenpox when you deliver, your baby might be treated with an immune globulin product shortly after birth to try to prevent neonatal varicella. If your baby develops chickenpox  in the first two weeks of life, antiviral drugs might be given as well.  If you're considering pregnancy and you haven't already had chickenpox or been vaccinated, ask your health care provider about the chickenpox vaccine. It's safe for adults, but it's recommended that you wait until three months after your second dose of the vaccine before trying  to conceive. If you're not sure whether you're immune, your health care provider can do a blood test to find out if you're immune or have already had the vaccine.

## 2021-03-16 ENCOUNTER — Encounter: Payer: Self-pay | Admitting: Osteopathic Medicine

## 2021-05-07 ENCOUNTER — Encounter: Payer: Self-pay | Admitting: Osteopathic Medicine

## 2021-05-11 ENCOUNTER — Telehealth: Payer: Commercial Managed Care - PPO | Admitting: Medical-Surgical

## 2021-05-16 ENCOUNTER — Telehealth (INDEPENDENT_AMBULATORY_CARE_PROVIDER_SITE_OTHER): Payer: Commercial Managed Care - PPO | Admitting: Medical-Surgical

## 2021-05-16 ENCOUNTER — Encounter: Payer: Self-pay | Admitting: Medical-Surgical

## 2021-05-16 DIAGNOSIS — Z832 Family history of diseases of the blood and blood-forming organs and certain disorders involving the immune mechanism: Secondary | ICD-10-CM | POA: Diagnosis not present

## 2021-05-16 DIAGNOSIS — Z3169 Encounter for other general counseling and advice on procreation: Secondary | ICD-10-CM

## 2021-05-16 NOTE — Progress Notes (Signed)
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

## 2021-05-16 NOTE — Progress Notes (Signed)
Virtual Visit via Video Note  I connected with Molly Chase on 05/16/21 at  1:20 PM EDT by a video enabled telemedicine application and verified that I am speaking with the correct person using two identifiers.   I discussed the limitations of evaluation and management by telemedicine and the availability of in person appointments. The patient expressed understanding and agreed to proceed.  Patient location: home Provider locations: office  Subjective:    CC: factor V testing  HPI: Very pleasant 30 year old female presenting via MyChart video visit to discuss testing for factor V Leiden.  Notes that approximately 2 months ago, her 49 year old cousin had shortness of breath and chest pain.  They were taken to the emergency room and found to have several PEs.  They recently heard back from genetic testing that they were positive for factor V Leiden.  After hearing that, her aunt on her dad side also got tested and was positive for it as well.  Notes her grandmother lost a baby at [redacted] weeks gestation due to a blood clot in the umbilical cord.  Her grandmother was never tested for factor V Leiden but this would explain the issue.  She has no personal history of blood clots and has given birth to a child without complication.  She and her partner are trying to conceive and are very worried about possible factor V Leiden given her family history.  She would like to have testing for confirmation so she knows how to proceed.  Plans to establish with a new OB/GYN for the future pregnancy and would like to see [facility].    Past medical history, Surgical history, Family history not pertinant except as noted below, Social history, Allergies, and medications have been entered into the medical record, reviewed, and corrections made.   Review of Systems: See HPI for pertinent positives and negatives.   Objective:    General: Speaking clearly in complete sentences without any shortness of breath.   Alert and oriented x3.  Normal judgment. No apparent acute distress.  Impression and Recommendations:    1. Family history of factor V deficiency 2. Pre-conception counseling Checking CBC, factor V Leiden, and PT/INR/PTT.  If positive for factor V Leiden, recommend she establish with her OB/GYN prior to conception as they may have specific protocols for patients in that situation. - Factor 5 leiden - CBC - [id number]/INR AND PTT  I discussed the assessment and treatment plan with the patient. The patient was provided an opportunity to ask questions and all were answered. The patient agreed with the plan and demonstrated an understanding of the instructions.   The patient was advised to call back or seek an in-person evaluation if the symptoms worsen or if the condition fails to improve as anticipated.  20 minutes of non-face-to-face time was provided during this encounter.  Return if symptoms worsen or fail to improve.  Thayer Ohm, DNP, APRN, FNP-BC [facility]

## 2021-05-20 ENCOUNTER — Encounter: Payer: Self-pay | Admitting: Medical-Surgical

## 2021-05-22 LAB — CBC
HCT: 39.8 % (ref 35.0–45.0)
Hemoglobin: 13.2 g/dL (ref 11.7–15.5)
MCH: 30.8 pg (ref 27.0–33.0)
MCHC: 33.2 g/dL (ref 32.0–36.0)
MCV: 92.8 fL (ref 80.0–100.0)
MPV: 9.7 fL (ref 7.5–12.5)
Platelets: 336 10*3/uL (ref 140–400)
RBC: 4.29 10*6/uL (ref 3.80–5.10)
RDW: 11.4 % (ref 11.0–15.0)
WBC: 9.7 10*3/uL (ref 3.8–10.8)

## 2021-05-22 LAB — CP4508-PT/INR AND PTT
INR: 1
Prothrombin Time: 10.2 s (ref 9.0–11.5)
aPTT: 30 s (ref 23–32)

## 2021-05-22 LAB — FACTOR 5 LEIDEN: Result: NEGATIVE

## 2021-08-11 LAB — OB RESULTS CONSOLE GBS: GBS: POSITIVE

## 2021-08-11 LAB — OB RESULTS CONSOLE RUBELLA ANTIBODY, IGM: Rubella: IMMUNE

## 2021-08-11 LAB — OB RESULTS CONSOLE RPR: RPR: NONREACTIVE

## 2021-08-11 LAB — OB RESULTS CONSOLE HIV ANTIBODY (ROUTINE TESTING): HIV: NONREACTIVE

## 2021-08-11 LAB — OB RESULTS CONSOLE HEPATITIS B SURFACE ANTIGEN: Hepatitis B Surface Ag: NEGATIVE

## 2021-09-05 LAB — OB RESULTS CONSOLE GC/CHLAMYDIA
Chlamydia: NEGATIVE
Gonorrhea: NEGATIVE

## 2021-09-11 ENCOUNTER — Encounter (HOSPITAL_COMMUNITY): Payer: Self-pay | Admitting: Obstetrics and Gynecology

## 2021-09-11 ENCOUNTER — Other Ambulatory Visit: Payer: Self-pay

## 2021-09-11 ENCOUNTER — Inpatient Hospital Stay (HOSPITAL_COMMUNITY)
Admission: AD | Admit: 2021-09-11 | Discharge: 2021-09-11 | Disposition: A | Discharge status: Home / Self Care | Payer: Commercial Managed Care - PPO | Attending: Obstetrics and Gynecology | Admitting: Obstetrics and Gynecology

## 2021-09-11 ENCOUNTER — Inpatient Hospital Stay (HOSPITAL_BASED_OUTPATIENT_CLINIC_OR_DEPARTMENT_OTHER): Payer: Commercial Managed Care - PPO

## 2021-09-11 DIAGNOSIS — O209 Hemorrhage in early pregnancy, unspecified: Secondary | ICD-10-CM | POA: Insufficient documentation

## 2021-09-11 DIAGNOSIS — O26892 Other specified pregnancy related conditions, second trimester: Secondary | ICD-10-CM | POA: Insufficient documentation

## 2021-09-11 DIAGNOSIS — O43199 Other malformation of placenta, unspecified trimester: Secondary | ICD-10-CM

## 2021-09-11 DIAGNOSIS — O43192 Other malformation of placenta, second trimester: Secondary | ICD-10-CM | POA: Insufficient documentation

## 2021-09-11 DIAGNOSIS — Z3A15 15 weeks gestation of pregnancy: Secondary | ICD-10-CM | POA: Diagnosis not present

## 2021-09-11 DIAGNOSIS — O26852 Spotting complicating pregnancy, second trimester: Secondary | ICD-10-CM

## 2021-09-11 DIAGNOSIS — R1030 Lower abdominal pain, unspecified: Secondary | ICD-10-CM | POA: Insufficient documentation

## 2021-09-11 DIAGNOSIS — O4692 Antepartum hemorrhage, unspecified, second trimester: Secondary | ICD-10-CM

## 2021-09-11 DIAGNOSIS — Z6741 Type O blood, Rh negative: Secondary | ICD-10-CM | POA: Insufficient documentation

## 2021-09-11 LAB — WET PREP, GENITAL
Clue Cells Wet Prep HPF POC: NONE SEEN
Sperm: NONE SEEN
Trich, Wet Prep: NONE SEEN
WBC, Wet Prep HPF POC: <10 (ref <10)
Yeast Wet Prep HPF POC: NONE SEEN

## 2021-09-11 NOTE — MAU Provider Note (Signed)
History     CSN: [id number]  Arrival date and time: 09/11/21 1503   Event Date/Time   First Provider Initiated Contact with Patient 09/11/21 1715      Chief Complaint  Patient presents with   Vaginal Bleeding   Abdominal Cramping   HPI  [name] is a 30 y.o. female G2P1001 @ [redacted]w[redacted]d prsenting to MAU with bleeding. She reports the bleeding started today at 1300. The bleeding initially was dripping in the toilet. Now the bleeding is "spotting" and is dark in color. She reports abdominal cramping in her lower abdomen. The pain comes and goes. The pain feels like pain in her vagina and her lower abdomen. She rates her pain 2/10.   Of note, patient had similar bleeding around 6 weeks and received Rhogam for RN negative blood type.   OB History     Gravida  2   Para  1   Term  1   Preterm      AB      Living  1      SAB      IAB      Ectopic      Multiple  0   Live Births  1           Past Medical History:  Diagnosis Date   H pylori ulcer    Ovarian cyst    PCOS (polycystic ovarian syndrome)     Past Surgical History:  Procedure Laterality Date   NO PAST SURGERIES     UPPER GI ENDOSCOPY      Family History  Problem Relation Age of Onset   Healthy Mother    Healthy Father    High blood pressure Father    Heart disease Maternal Grandmother    Hypertension Maternal Grandmother    Diabetes Maternal Grandmother    Heart attack Maternal Grandmother    High blood pressure Maternal Grandmother    Heart attack Maternal Grandfather    Hypertension Maternal Grandfather    Diabetes Maternal Grandfather    Stroke Paternal Grandfather    Heart disease Paternal Grandfather    Pulmonary embolism Paternal Grandfather    Skin cancer Paternal Grandfather    Heart attack Paternal Grandfather     Social History   Tobacco Use   Smoking status: Never   Smokeless tobacco: Never  Vaping Use   Vaping Use: Never used  Substance Use Topics   Alcohol  use: Not Currently    Alcohol/week: 4.0 standard drinks    Types: 4 Standard drinks or equivalent per week   Drug use: No    Allergies: No Known Allergies  Medications Prior to Admission  Medication Sig Dispense Refill Last Dose   aspirin EC 81 MG tablet Take by mouth daily. Swallow whole.      ondansetron (ZOFRAN) 8 MG tablet Take by mouth every 8 (eight) hours as needed for nausea or vomiting.      Prenatal Vit-Fe Fumarate-FA (MULTIVITAMIN-PRENATAL) 27-0.8 MG TABS tablet Take 1 tablet by mouth daily at 12 noon.      acetaminophen (TYLENOL) 500 MG tablet Take 500 mg by mouth every 6 (six) hours as needed.      ibuprofen (ADVIL) 200 MG tablet Take 200 mg by mouth every 6 (six) hours as needed.      Results for orders placed or performed during the hospital encounter of 09/11/21 (from the past 48 hour(s))  Wet prep, genital     Status: None   Collection  Time: 09/11/21  5:40 PM  Result Value Ref Range   Yeast Wet Prep HPF POC NONE SEEN NONE SEEN   Trich, Wet Prep NONE SEEN NONE SEEN   Clue Cells Wet Prep HPF POC NONE SEEN NONE SEEN   WBC, Wet Prep HPF POC <10 <10   Sperm NONE SEEN     Comment: Performed at [facility], [address], [place], [place] [address]    Review of Systems  Constitutional:  Negative for fever.  Gastrointestinal:  Positive for abdominal pain.  Genitourinary:  Positive for vaginal discharge.  Physical Exam   Blood pressure 109/70, pulse 77, temperature 97.7 F (36.5 C), temperature source Oral, resp. rate 16, SpO2 99 %.  Physical Exam Constitutional:      General: She is not in acute distress.    Appearance: Normal appearance. She is normal weight. She is not ill-appearing, toxic-appearing or diaphoretic.  Abdominal:     Palpations: Abdomen is soft.     Tenderness: There is no abdominal tenderness.  Genitourinary:    Comments: Vagina - Small amount of pink vaginal discharge, no odor  Cervix - No contact bleeding, no active bleeding  Bimanual  exam: Cervix closed, long, thick, posterior  Wet prep done Chaperone present for exam.   Skin:    General: Skin is warm.  Neurological:     Mental Status: She is alert and oriented to person, place, and time.  Psychiatric:        Behavior: Behavior normal.   MAU Course  Procedures None  MDM  O negative blood type- Rhogam given at 6 weeks.  US shows marginal cord insertion, however no previa. Cervical length is >3 cm.   Assessment and Plan   A:  Marginal insertion of umbilical cord affecting management of mother - Plan: Discharge patient  Vaginal bleeding affecting early pregnancy - Plan: [place] MFM OB LIMITED, [place] MFM OB LIMITED, Discharge patient  [redacted] weeks gestation of pregnancy - Plan: Discharge patient  Type O blood, Rh negative - Plan: Discharge patient    P:  Discharge home Pelvic rest Keep your appointment in the office next week Reviewed Marginal cord insertion.  Bleeding precautions.  Duane Lope, NP 09/11/2021 7:24 PM

## 2021-09-11 NOTE — MAU Note (Signed)
Patient arrived from home complaining of vaginal bleeding and lower abdominal and vaginal cramping. Pain 2/10. Patient stated that around 1pm. Patient stated that she called the office and they told her to come to MAU for evaluation.

## 2021-10-09 NOTE — L&D Delivery Note (Signed)
Delivery Note At 6:07 PM a viable and healthy female was delivered via Vaginal, Spontaneous (Presentation: Left Occiput Anterior).  APGAR: 8, 9; weight pending .   Placenta status: Spontaneous, Intact.  Cord: 3 vessels with the following complications: [place] x one reduced on perineumNone.  Cord pH:                                    Anesthesia: Epidural Episiotomy: None Lacerations:  second Suture Repair: 2.0 vicryl rapide Est. Blood Loss (mL): 100  Mom to postpartum.  Baby to Couplet care / Skin to Skin.  [name] J 02/27/2022, 6:20 PM

## 2021-11-28 LAB — OB RESULTS CONSOLE ANTIBODY SCREEN: Antibody Screen: NEGATIVE

## 2021-12-27 ENCOUNTER — Other Ambulatory Visit: Payer: Self-pay | Admitting: Obstetrics and Gynecology

## 2021-12-27 DIAGNOSIS — O99013 Anemia complicating pregnancy, third trimester: Secondary | ICD-10-CM

## 2022-01-04 ENCOUNTER — Encounter (HOSPITAL_COMMUNITY): Payer: Commercial Managed Care - PPO

## 2022-01-11 ENCOUNTER — Non-Acute Institutional Stay (HOSPITAL_COMMUNITY)
Admission: RE | Admit: 2022-01-11 | Discharge: 2022-01-11 | Disposition: A | Discharge status: Home / Self Care | Payer: Commercial Managed Care - PPO | Source: Ambulatory Visit | Attending: Internal Medicine | Admitting: Internal Medicine

## 2022-01-11 DIAGNOSIS — Z3A32 32 weeks gestation of pregnancy: Secondary | ICD-10-CM | POA: Diagnosis not present

## 2022-01-11 DIAGNOSIS — O99013 Anemia complicating pregnancy, third trimester: Secondary | ICD-10-CM | POA: Diagnosis present

## 2022-01-11 MED ORDER — ACETAMINOPHEN 500 MG PO TABS
500.0000 mg | ORAL_TABLET | ORAL | Status: DC
  Administered 2022-01-11: 500 mg via ORAL
  Filled 2022-01-11: qty 1

## 2022-01-11 MED ORDER — DIPHENHYDRAMINE HCL 25 MG PO CAPS
25.0000 mg | ORAL_CAPSULE | ORAL | Status: DC
  Administered 2022-01-11: 25 mg via ORAL
  Filled 2022-01-11: qty 1

## 2022-01-11 MED ORDER — SODIUM CHLORIDE 0.9 % IV SOLN
500.0000 mg | INTRAVENOUS | Status: DC
  Administered 2022-01-11: 500 mg via INTRAVENOUS
  Filled 2022-01-11: qty 25

## 2022-01-11 MED ORDER — SODIUM CHLORIDE 0.9 % IV SOLN: INTRAVENOUS | Status: DC | PRN

## 2022-01-11 NOTE — Progress Notes (Signed)
PATIENT CARE CENTER NOTE ? ? ?Diagnosis: Anemia complicating pregnancy in third trimester (O99.013) ? ? ?Provider: Rhoderick Moody, MD ? ? ?Procedure: Venofer infusion  ? ? ?Note:  Patient received Venofer 500 mg infusion (dose 1 of 2) via PIV. Pre-medications given per order (Tylenol and Benadryl). Patient tolerated infusion well with no adverse reaction. Vital signs stable. Discharge instructions given. Patient will follow with with OB before scheduling next infusion. Alert, oriented and ambulatory at discharge.   ?

## 2022-01-27 ENCOUNTER — Non-Acute Institutional Stay (HOSPITAL_COMMUNITY)
Admission: RE | Admit: 2022-01-27 | Discharge: 2022-01-27 | Disposition: A | Discharge status: Home / Self Care | Payer: Commercial Managed Care - PPO | Source: Ambulatory Visit | Attending: Internal Medicine | Admitting: Internal Medicine

## 2022-01-27 DIAGNOSIS — O99013 Anemia complicating pregnancy, third trimester: Secondary | ICD-10-CM | POA: Diagnosis present

## 2022-01-27 MED ORDER — DIPHENHYDRAMINE HCL 25 MG PO CAPS
25.0000 mg | ORAL_CAPSULE | Freq: Once | ORAL | Status: AC
  Administered 2022-01-27: 25 mg via ORAL
  Filled 2022-01-27: qty 1

## 2022-01-27 MED ORDER — SODIUM CHLORIDE 0.9 % IV SOLN
500.0000 mg | Freq: Once | INTRAVENOUS | Status: AC
  Administered 2022-01-27: 500 mg via INTRAVENOUS
  Filled 2022-01-27: qty 25

## 2022-01-27 MED ORDER — SODIUM CHLORIDE 0.9 % IV SOLN: INTRAVENOUS | Status: DC | PRN

## 2022-01-27 MED ORDER — ACETAMINOPHEN 500 MG PO TABS
500.0000 mg | ORAL_TABLET | Freq: Once | ORAL | Status: AC
  Administered 2022-01-27: 500 mg via ORAL
  Filled 2022-01-27: qty 1

## 2022-01-27 NOTE — Progress Notes (Signed)
PATIENT CARE CENTER NOTE ?  ?  ?Diagnosis: Anemia complicating pregnancy in third trimester (O99.013) ?  ?  ?Provider: Rhoderick Moody, MD ?  ?  ?Procedure: Venofer infusion  ?  ?  ?Note:  Patient received Venofer 500 mg infusion (dose 2 of 2) via PIV. Pre-medications given per order (Tylenol and Benadryl). Patient tolerated infusion well with no adverse reaction. Vital signs stable. AVS offered but patient refused. Patient alert, oriented and ambulatory at discharge.  ?

## 2022-02-22 ENCOUNTER — Telehealth (HOSPITAL_COMMUNITY): Payer: Self-pay | Admitting: *Deleted

## 2022-02-22 ENCOUNTER — Encounter (HOSPITAL_COMMUNITY): Payer: Self-pay | Admitting: *Deleted

## 2022-02-22 NOTE — Telephone Encounter (Signed)
Preadmission screen  

## 2022-02-24 ENCOUNTER — Other Ambulatory Visit: Payer: Self-pay | Admitting: Obstetrics and Gynecology

## 2022-02-27 ENCOUNTER — Encounter (HOSPITAL_COMMUNITY): Payer: Self-pay | Admitting: Obstetrics and Gynecology

## 2022-02-27 ENCOUNTER — Inpatient Hospital Stay (HOSPITAL_COMMUNITY): Payer: Commercial Managed Care - PPO

## 2022-02-27 ENCOUNTER — Inpatient Hospital Stay (HOSPITAL_COMMUNITY)
Admission: AD | Admit: 2022-02-27 | Discharge: 2022-02-28 | DRG: 807 | Disposition: A | Discharge status: Home / Self Care | Payer: Commercial Managed Care - PPO | Attending: Obstetrics and Gynecology | Admitting: Obstetrics and Gynecology

## 2022-02-27 ENCOUNTER — Inpatient Hospital Stay (HOSPITAL_COMMUNITY): Payer: Commercial Managed Care - PPO | Admitting: Anesthesiology

## 2022-02-27 ENCOUNTER — Other Ambulatory Visit: Payer: Self-pay

## 2022-02-27 DIAGNOSIS — Z20822 Contact with and (suspected) exposure to covid-19: Secondary | ICD-10-CM | POA: Diagnosis present

## 2022-02-27 DIAGNOSIS — Z3A39 39 weeks gestation of pregnancy: Secondary | ICD-10-CM | POA: Diagnosis not present

## 2022-02-27 DIAGNOSIS — Z3490 Encounter for supervision of normal pregnancy, unspecified, unspecified trimester: Principal | ICD-10-CM | POA: Diagnosis present

## 2022-02-27 DIAGNOSIS — O99824 Streptococcus B carrier state complicating childbirth: Secondary | ICD-10-CM | POA: Diagnosis present

## 2022-02-27 DIAGNOSIS — Z6791 Unspecified blood type, Rh negative: Secondary | ICD-10-CM | POA: Diagnosis not present

## 2022-02-27 DIAGNOSIS — O26893 Other specified pregnancy related conditions, third trimester: Secondary | ICD-10-CM | POA: Diagnosis present

## 2022-02-27 LAB — RESP PANEL BY RT-PCR (FLU A&B, COVID) ARPGX2
Influenza A by PCR: NEGATIVE
Influenza B by PCR: NEGATIVE
SARS Coronavirus 2 by RT PCR: NEGATIVE

## 2022-02-27 LAB — CBC
HCT: 35.9 % — ABNORMAL LOW (ref 36.0–46.0)
HCT: 35.1 % — ABNORMAL LOW (ref 36.0–46.0)
Hemoglobin: 12.3 g/dL (ref 12.0–15.0)
Hemoglobin: 11.7 g/dL — ABNORMAL LOW (ref 12.0–15.0)
MCH: 31.4 pg (ref 26.0–34.0)
MCH: 31.3 pg (ref 26.0–34.0)
MCHC: 34.3 g/dL (ref 30.0–36.0)
MCHC: 33.3 g/dL (ref 30.0–36.0)
MCV: 91.6 fL (ref 80.0–100.0)
MCV: 93.9 fL (ref 80.0–100.0)
Platelets: 137 10*3/uL — ABNORMAL LOW (ref 150–400)
Platelets: 135 10*3/uL — ABNORMAL LOW (ref 150–400)
RBC: 3.92 MIL/uL (ref 3.87–5.11)
RBC: 3.74 MIL/uL — ABNORMAL LOW (ref 3.87–5.11)
RDW: 14.3 % (ref 11.5–15.5)
RDW: 14.1 % (ref 11.5–15.5)
WBC: 11.3 10*3/uL — ABNORMAL HIGH (ref 4.0–10.5)
WBC: 16 10*3/uL — ABNORMAL HIGH (ref 4.0–10.5)
nRBC: 0 % (ref 0.0–0.2)
nRBC: 0 % (ref 0.0–0.2)

## 2022-02-27 MED ORDER — FENTANYL-BUPIVACAINE-NACL 0.5-0.125-0.9 MG/250ML-% EP SOLN
12.0000 mL/h | EPIDURAL | Status: DC | PRN
  Administered 2022-02-27: 12 mL/h via EPIDURAL
  Filled 2022-02-27: qty 250

## 2022-02-27 MED ORDER — ONDANSETRON HCL 4 MG/2ML IJ SOLN: 4.0000 mg | Freq: Four times a day (QID) | INTRAMUSCULAR | Status: DC | PRN

## 2022-02-27 MED ORDER — EPHEDRINE 5 MG/ML INJ: 10.0000 mg | INTRAVENOUS | Status: DC | PRN

## 2022-02-27 MED ORDER — LACTATED RINGERS IV SOLN: INTRAVENOUS | Status: DC

## 2022-02-27 MED ORDER — SENNOSIDES-DOCUSATE SODIUM 8.6-50 MG PO TABS
2.0000 | ORAL_TABLET | Freq: Every day | ORAL | Status: DC
  Administered 2022-02-28: 2 via ORAL
  Filled 2022-02-27: qty 2

## 2022-02-27 MED ORDER — PHENYLEPHRINE 80 MCG/ML (10ML) SYRINGE FOR IV PUSH (FOR BLOOD PRESSURE SUPPORT): 80.0000 ug | PREFILLED_SYRINGE | INTRAVENOUS | Status: DC | PRN

## 2022-02-27 MED ORDER — SOD CITRATE-CITRIC ACID 500-334 MG/5ML PO SOLN
30.0000 mL | ORAL | Status: DC | PRN
Start: 2022-02-27 — End: 2022-02-27

## 2022-02-27 MED ORDER — LIDOCAINE HCL (PF) 1 % IJ SOLN: 30.0000 mL | INTRAMUSCULAR | Status: DC | PRN

## 2022-02-27 MED ORDER — IBUPROFEN 600 MG PO TABS
600.0000 mg | ORAL_TABLET | Freq: Four times a day (QID) | ORAL | Status: DC
  Administered 2022-02-27 – 2022-02-28 (×4): 600 mg via ORAL
  Filled 2022-02-27 (×4): qty 1

## 2022-02-27 MED ORDER — LIDOCAINE HCL (PF) 1 % IJ SOLN
INTRAMUSCULAR | Status: DC | PRN
  Administered 2022-02-27 (×2): 4 mL via EPIDURAL

## 2022-02-27 MED ORDER — METHYLERGONOVINE MALEATE 0.2 MG PO TABS: 0.2000 mg | ORAL_TABLET | ORAL | Status: DC | PRN

## 2022-02-27 MED ORDER — DIPHENHYDRAMINE HCL 25 MG PO CAPS: 25.0000 mg | ORAL_CAPSULE | Freq: Four times a day (QID) | ORAL | Status: DC | PRN

## 2022-02-27 MED ORDER — ACETAMINOPHEN 325 MG PO TABS: 650.0000 mg | ORAL_TABLET | ORAL | Status: DC | PRN

## 2022-02-27 MED ORDER — PRENATAL MULTIVITAMIN CH
1.0000 | ORAL_TABLET | Freq: Every day | ORAL | Status: DC
  Administered 2022-02-28: 1 via ORAL
  Filled 2022-02-27: qty 1

## 2022-02-27 MED ORDER — DIPHENHYDRAMINE HCL 50 MG/ML IJ SOLN: 12.5000 mg | INTRAMUSCULAR | Status: DC | PRN

## 2022-02-27 MED ORDER — OXYTOCIN-SODIUM CHLORIDE 30-0.9 UT/500ML-% IV SOLN
2.5000 [IU]/h | INTRAVENOUS | Status: DC
  Administered 2022-02-27: 2.5 [IU]/h via INTRAVENOUS

## 2022-02-27 MED ORDER — SODIUM CHLORIDE 0.9 % IV SOLN
5.0000 10*6.[IU] | Freq: Once | INTRAVENOUS | Status: AC
  Administered 2022-02-27: 5 10*6.[IU] via INTRAVENOUS
  Filled 2022-02-27: qty 5

## 2022-02-27 MED ORDER — TERBUTALINE SULFATE 1 MG/ML IJ SOLN: 0.2500 mg | Freq: Once | INTRAMUSCULAR | Status: DC | PRN

## 2022-02-27 MED ORDER — OXYTOCIN-SODIUM CHLORIDE 30-0.9 UT/500ML-% IV SOLN
1.0000 m[IU]/min | INTRAVENOUS | Status: DC
  Administered 2022-02-27: 2 m[IU]/min via INTRAVENOUS
  Filled 2022-02-27: qty 500

## 2022-02-27 MED ORDER — PENICILLIN G POT IN DEXTROSE 60000 UNIT/ML IV SOLN
3.0000 10*6.[IU] | INTRAVENOUS | Status: DC
  Administered 2022-02-27: 3 10*6.[IU] via INTRAVENOUS
  Filled 2022-02-27: qty 50

## 2022-02-27 MED ORDER — ONDANSETRON HCL 4 MG/2ML IJ SOLN: 4.0000 mg | INTRAMUSCULAR | Status: DC | PRN

## 2022-02-27 MED ORDER — SIMETHICONE 80 MG PO CHEW
80.0000 mg | CHEWABLE_TABLET | ORAL | Status: DC | PRN
Start: 2022-02-27 — End: 2022-03-01

## 2022-02-27 MED ORDER — BENZOCAINE-MENTHOL 20-0.5 % EX AERO
1.0000 "application " | INHALATION_SPRAY | CUTANEOUS | Status: DC | PRN
  Administered 2022-02-27: 1 via TOPICAL
  Filled 2022-02-27: qty 56

## 2022-02-27 MED ORDER — COCONUT OIL OIL
1.0000 "application " | TOPICAL_OIL | Status: DC | PRN
  Administered 2022-02-27: 1 via TOPICAL

## 2022-02-27 MED ORDER — WITCH HAZEL-GLYCERIN EX PADS: 1.0000 "application " | MEDICATED_PAD | CUTANEOUS | Status: DC | PRN

## 2022-02-27 MED ORDER — LACTATED RINGERS IV SOLN: 500.0000 mL | Freq: Once | INTRAVENOUS | Status: DC

## 2022-02-27 MED ORDER — TETANUS-DIPHTH-ACELL PERTUSSIS 5-2.5-18.5 LF-MCG/0.5 IM SUSY: 0.5000 mL | PREFILLED_SYRINGE | Freq: Once | INTRAMUSCULAR | Status: DC

## 2022-02-27 MED ORDER — ACETAMINOPHEN 325 MG PO TABS
650.0000 mg | ORAL_TABLET | ORAL | Status: DC | PRN
  Administered 2022-02-28: 650 mg via ORAL
  Filled 2022-02-27: qty 2

## 2022-02-27 MED ORDER — ONDANSETRON HCL 4 MG PO TABS: 4.0000 mg | ORAL_TABLET | ORAL | Status: DC | PRN

## 2022-02-27 MED ORDER — OXYTOCIN BOLUS FROM INFUSION
333.0000 mL | Freq: Once | INTRAVENOUS | Status: AC
  Administered 2022-02-27: 333 mL via INTRAVENOUS

## 2022-02-27 MED ORDER — OXYCODONE-ACETAMINOPHEN 5-325 MG PO TABS: 1.0000 | ORAL_TABLET | ORAL | Status: DC | PRN

## 2022-02-27 MED ORDER — ZOLPIDEM TARTRATE 5 MG PO TABS: 5.0000 mg | ORAL_TABLET | Freq: Every evening | ORAL | Status: DC | PRN

## 2022-02-27 MED ORDER — DIBUCAINE (PERIANAL) 1 % EX OINT: 1.0000 "application " | TOPICAL_OINTMENT | CUTANEOUS | Status: DC | PRN

## 2022-02-27 MED ORDER — METHYLERGONOVINE MALEATE 0.2 MG/ML IJ SOLN: 0.2000 mg | INTRAMUSCULAR | Status: DC | PRN

## 2022-02-27 MED ORDER — LACTATED RINGERS IV SOLN: 500.0000 mL | INTRAVENOUS | Status: DC | PRN

## 2022-02-27 MED ORDER — OXYCODONE-ACETAMINOPHEN 5-325 MG PO TABS: 2.0000 | ORAL_TABLET | ORAL | Status: DC | PRN

## 2022-02-27 NOTE — Lactation Note (Signed)
This note was copied from a baby's chart. Lactation Consultation Note  Patient Name: Molly Chase [id number] Date: 02/27/2022 Reason for consult: L&D Initial assessment;1st time breastfeeding;Primapara;Term Age:30 hours  P2, Term, Female Infant  LC entered the room and baby was already latched on the left breast. Mom stated that the latch was pinching. LC flipped baby's top lip and pulled down her chin. Mom states that the latch is still pinching. LC assessed baby's mouth and noted that her labial frenulum may be a little tight. There was a compression strip and the nipple appeared to be pinched when LC unlatched baby.   LC switched baby to the right breast in the football hold and sandwiched the breast. Mom says that the latch feels much better. LC encouraged mom to sandwich the breast to help with getting baby latched more deeply. Baby is very aggressive with getting latched and tries to latch shallow and suck in the nipple. LC let mom know that we want baby to open wide and take in the areola and the nipple.   Baby latched well and was still feeding when [place] left.  Maternal Data Does the patient have breastfeeding experience prior to this delivery?: No  Feeding    LATCH Score Latch: Grasps breast easily, tongue down, lips flanged, rhythmical sucking.  Audible Swallowing: A few with stimulation  Type of Nipple: Everted at rest and after stimulation  Comfort (Breast/Nipple): Filling, red/small blisters or bruises, mild/mod discomfort  Hold (Positioning): Assistance needed to correctly position infant at breast and maintain latch.  LATCH Score: 7   Lactation Tools Discussed/Used    Interventions Interventions: Assisted with latch;Adjust position;Support pillows  Discharge    Consult Status Consult Status: Follow-up from L&D    Lysbeth Penner 02/27/2022, 7:00 PM

## 2022-02-27 NOTE — H&P (Signed)
Molly Chase is a 31 y.o. female presenting for IOL for history of gest htn and history of third degree laceration. OB History     Gravida  2   Para  1   Term  1   Preterm      AB      Living  1      SAB      IAB      Ectopic      Multiple  0   Live Births  1          Past Medical History:  Diagnosis Date   H pylori ulcer    Ovarian cyst    PCOS (polycystic ovarian syndrome)    Past Surgical History:  Procedure Laterality Date   NO PAST SURGERIES     UPPER GI ENDOSCOPY     Family History: family history includes Diabetes in her maternal grandfather and maternal grandmother; Healthy in her father and mother; Heart attack in her maternal grandfather, maternal grandmother, and paternal grandfather; Heart disease in her maternal grandmother and paternal grandfather; High blood pressure in her father and maternal grandmother; Hypertension in her maternal grandfather and maternal grandmother; Parkinson's disease in her paternal grandfather; Pulmonary embolism in her paternal grandfather; Skin cancer in her paternal grandfather; Stroke in her paternal grandfather. Social History:  reports that she has never smoked. She has never used smokeless tobacco. She reports that she does not currently use alcohol after a past usage of about 4.0 standard drinks per week. She reports that she does not use drugs.     Maternal Diabetes: No Genetic Screening: Normal Maternal Ultrasounds/Referrals: Normal Fetal Ultrasounds or other Referrals:  None Maternal Substance Abuse:  No Significant Maternal Medications:  None Significant Maternal Lab Results:  Group B Strep positive Other Comments:  None  Review of Systems  Constitutional: Negative.   All other systems reviewed and are negative. Maternal Medical History:  Reason for admission: Contractions.   Contractions: Onset was yesterday.   Frequency: rare.   Perceived severity is mild.   Fetal activity: Perceived fetal  activity is normal.   Last perceived fetal movement was within the past hour.   Prenatal complications: PIH.   Prenatal Complications - Diabetes: none.    There were no vitals taken for this visit. Maternal Exam:  Uterine Assessment: Contraction strength is mild.  Contraction frequency is irregular.  Abdomen: Patient reports no abdominal tenderness. Fetal presentation: vertex Introitus: Normal vulva. Normal vagina.  Ferning test: not done.  Nitrazine test: not done. Amniotic fluid character: not assessed. Pelvis: adequate for delivery.   Cervix: Cervix evaluated by digital exam.    Physical Exam Constitutional:      Appearance: Normal appearance. She is normal weight.  HENT:     Head: Normocephalic and atraumatic.  Cardiovascular:     Rate and Rhythm: Normal rate.     Pulses: Normal pulses.     Heart sounds: Normal heart sounds.  Pulmonary:     Effort: Pulmonary effort is normal.     Breath sounds: Normal breath sounds.  Abdominal:     General: Abdomen is flat.     Palpations: Abdomen is soft.  Genitourinary:    General: Normal vulva.  Musculoskeletal:        General: Normal range of motion.     Cervical back: Normal range of motion and neck supple.  Skin:    General: Skin is warm.  Neurological:     General: No focal deficit present.  Mental Status: She is alert.  Psychiatric:        Thought Content: Thought content normal.        Judgment: Judgment normal.    Prenatal labs: ABO, Rh:   Antibody: Negative (02/20 0000) Rubella: Immune (11/03 0000) RPR: Nonreactive (11/03 0000)  HBsAg: Negative (11/03 0000)  HIV: Non-reactive (11/03 0000)  GBS: Positive/-- (11/03 0000)   Assessment/Plan: 39 wk IUP GBS pos History of third degree laceration History of gest htn in previous pregnancy IOL   Molly Chase J 02/27/2022, 10:52 AM

## 2022-02-27 NOTE — Anesthesia Preprocedure Evaluation (Signed)
Anesthesia Evaluation  Patient identified by MRN, date of birth, ID band Patient awake    Reviewed: Allergy & Precautions, Patient's Chart, lab work & pertinent test results  History of Anesthesia Complications Negative for: history of anesthetic complications  Airway Mallampati: II  TM Distance: >3 FB Neck ROM: Full    Dental no notable dental hx.    Pulmonary neg pulmonary ROS,    Pulmonary exam normal        Cardiovascular negative cardio ROS Normal cardiovascular exam     Neuro/Psych negative neurological ROS  negative psych ROS   GI/Hepatic negative GI ROS, Neg liver ROS,   Endo/Other  negative endocrine ROS  Renal/GU negative Renal ROS  negative genitourinary   Musculoskeletal negative musculoskeletal ROS (+)   Abdominal   Peds  Hematology negative hematology ROS (+)   Anesthesia Other Findings Day of surgery medications reviewed with patient.  Reproductive/Obstetrics (+) Pregnancy                             Anesthesia Physical Anesthesia Plan  ASA: 2  Anesthesia Plan: Epidural   Post-op Pain Management:    Induction:   PONV Risk Score and Plan: Treatment may vary due to age or medical condition  Airway Management Planned: Natural Airway  Additional Equipment: Fetal Monitoring  Intra-op Plan:   Post-operative Plan:   Informed Consent: I have reviewed the patients History and Physical, chart, labs and discussed the procedure including the risks, benefits and alternatives for the proposed anesthesia with the patient or authorized representative who has indicated his/her understanding and acceptance.       Plan Discussed with:   Anesthesia Plan Comments:         Anesthesia Quick Evaluation  

## 2022-02-27 NOTE — Anesthesia Procedure Notes (Signed)
Epidural Patient location during procedure: OB Start time: 02/27/2022 2:46 PM End time: 02/27/2022 2:49 PM  Staffing Anesthesiologist: Brennan Bailey, MD Performed: anesthesiologist   Preanesthetic Checklist Completed: patient identified, IV checked, risks and benefits discussed, monitors and equipment checked, pre-op evaluation and timeout performed  Epidural Patient position: sitting Prep: DuraPrep and site prepped and draped Patient monitoring: continuous pulse ox, blood pressure and heart rate Approach: midline Location: L3-L4 Injection technique: LOR air  Needle:  Needle type: Tuohy  Needle gauge: 17 G Needle length: 9 cm Needle insertion depth: 5 cm Catheter type: closed end flexible Catheter size: 19 Gauge Catheter at skin depth: 10 cm Test dose: negative and Other (1% lidocaine)  Assessment Events: blood not aspirated, injection not painful, no injection resistance, no paresthesia and negative IV test  Additional Notes Patient identified. Risks, benefits, and alternatives discussed with patient including but not limited to bleeding, infection, nerve damage, paralysis, failed block, incomplete pain control, headache, blood pressure changes, nausea, vomiting, reactions to medication, itching, and postpartum back pain. Confirmed with bedside nurse the patient's most recent platelet count. Confirmed with patient that they are not currently taking any anticoagulation, have any bleeding history, or any family history of bleeding disorders. Patient expressed understanding and wished to proceed. All questions were answered. Sterile technique was used throughout the entire procedure. Please see nursing notes for vital signs.   Crisp LOR on first pass. Test dose was given through epidural catheter and negative prior to continuing to dose epidural or start infusion. Warning signs of high block given to the patient including shortness of breath, tingling/numbness in hands, complete  motor block, or any concerning symptoms with instructions to call for help. Patient was given instructions on fall risk and not to get out of bed. All questions and concerns addressed with instructions to call with any issues or inadequate analgesia.  Reason for block:procedure for pain

## 2022-02-28 LAB — CBC
HCT: 32.3 % — ABNORMAL LOW (ref 36.0–46.0)
Hemoglobin: 10.9 g/dL — ABNORMAL LOW (ref 12.0–15.0)
MCH: 31 pg (ref 26.0–34.0)
MCHC: 33.7 g/dL (ref 30.0–36.0)
MCV: 91.8 fL (ref 80.0–100.0)
Platelets: 126 10*3/uL — ABNORMAL LOW (ref 150–400)
RBC: 3.52 MIL/uL — ABNORMAL LOW (ref 3.87–5.11)
RDW: 14.2 % (ref 11.5–15.5)
WBC: 11.2 10*3/uL — ABNORMAL HIGH (ref 4.0–10.5)
nRBC: 0 % (ref 0.0–0.2)

## 2022-02-28 LAB — TYPE AND SCREEN
ABO/RH(D): O NEG
Antibody Screen: NEGATIVE

## 2022-02-28 LAB — RPR: RPR Ser Ql: NONREACTIVE

## 2022-02-28 MED ORDER — IBUPROFEN 600 MG PO TABS
600.0000 mg | ORAL_TABLET | Freq: Four times a day (QID) | ORAL | 0 refills | Status: AC
Start: 2022-03-01 — End: ?

## 2022-02-28 MED ORDER — RHO D IMMUNE GLOBULIN 1500 UNIT/2ML IJ SOSY
300.0000 ug | PREFILLED_SYRINGE | Freq: Once | INTRAMUSCULAR | Status: AC
  Administered 2022-02-28: 300 ug via INTRAVENOUS
  Filled 2022-02-28: qty 2

## 2022-02-28 NOTE — Discharge Summary (Signed)
OB Discharge Summary  Patient Name: Molly Chase DOB: 20-Jan-1991 MRN: [id number]  Date of admission: 02/27/2022 Delivering provider: Brien Few   Admitting diagnosis: Encounter for induction of labor [Z34.90] Intrauterine pregnancy: [redacted]w[redacted]d     Secondary diagnosis: Patient Active Problem List   Diagnosis Date Noted   SVD 5/22 02/28/2022   Postpartum care following vaginal delivery 5/22 02/28/2022   Second degree perineal laceration 02/28/2022   Encounter for induction of labor 02/27/2022   Rh negative, maternal 07/08/2018    Date of discharge: 02/28/2022   Discharge diagnosis: Principal Problem:   Postpartum care following vaginal delivery 5/22 Active Problems:   Rh negative, maternal   Encounter for induction of labor   SVD 5/22   Second degree perineal laceration                                                            Augmentation: AROM and Pitocin Pain control: Epidural  [id number] degree  Complications: None  Hospital course:  Induction of Labor With Vaginal Delivery   31 y.o. yo [id number] at [redacted]w[redacted]d was admitted to the hospital 02/27/2022 for induction of labor.  Indication for induction: Elective.  Patient had an uncomplicated labor course as follows: Membrane Rupture Time/Date: 1:33 PM ,02/27/2022   Delivery Method:Vaginal, Spontaneous  Episiotomy: None  Lacerations:  2nd degree  Details of delivery can be found in separate delivery note.  Patient had a routine postpartum course. Patient is discharged home 02/28/22.  Newborn Data: Birth date:02/27/2022  Birth time:6:07 PM  Gender:Female  Living status:Living  Apgars:8 ,9  Weight:3810 g   Physical exam  Vitals:   02/28/22 0130 02/28/22 0417 02/28/22 0810 02/28/22 1338  BP: 106/73 105/66 103/69 105/68  Pulse: 88 68 76 78  Resp: 18 18 18 18   Temp: 99 F (37.2 C) 98 F (36.7 C) 97.8 F (36.6 C) 97.9 F (36.6 C)  TempSrc: Oral Oral Oral Oral  SpO2: 99%  98% 100%  Weight:      Height:        General: alert, cooperative, and no distress Lochia: appropriate Uterine Fundus: firm Incision: N/A Perineum: repair intact, no edema DVT Evaluation: No evidence of DVT seen on physical exam.  Labs: Lab Results  Component Value Date   WBC 11.2 (H) 02/28/2022   HGB 10.9 (L) 02/28/2022   HCT 32.3 (L) 02/28/2022   MCV 91.8 02/28/2022   PLT 126 (L) 02/28/2022      02/28/2022    4:35 PM 07/09/2018    1:30 PM  Edinburgh Postnatal Depression Scale Screening Tool  I have been able to laugh and see the funny side of things. 0 0  I have looked forward with enjoyment to things. 0 0  I have blamed myself unnecessarily when things went wrong. 1 1  I have been anxious or worried for no good reason. 0 2  I have felt scared or panicky for no good reason. 1 1  Things have been getting on top of me. 0 1  I have been so unhappy that I have had difficulty sleeping. 0 0  I have felt sad or miserable. 0 0  I have been so unhappy that I have been crying. 0 0  The thought of harming myself has occurred to me. 0 0  [place]  Postnatal Depression Scale Total 2 5   Discharge instructions:  per After Visit Summary  After Visit Meds:  Allergies as of 02/28/2022   No Known Allergies      Medication List     STOP taking these medications    acetaminophen 500 MG tablet Commonly known as: TYLENOL   aspirin EC 81 MG tablet   ondansetron 8 MG tablet Commonly known as: ZOFRAN       TAKE these medications    ibuprofen 600 MG tablet Commonly known as: ADVIL Take 1 tablet (600 mg total) by mouth every 6 (six) hours. Start taking on: Mar 01, 2022 What changed:  medication strength how much to take when to take this reasons to take this   multivitamin-prenatal 27-0.8 MG Tabs tablet Take 1 tablet by mouth daily at 12 noon.       Activity: Advance as tolerated. Pelvic rest for 6 weeks.   Newborn Data: Live born female  Birth Weight: 8 lb 6.4 oz (3810 g) APGAR: 8, 9  Newborn  Delivery   Birth date/time: 02/27/2022 18:07:00 Delivery type: Vaginal, Spontaneous     Named Leonides Sake Baby Feeding: Bottle and Breast Disposition:home with mother  Delivery Report:   Review the Delivery Report for details.    Follow up:  Follow-up Information     Brien Few, MD. Schedule an appointment as soon as possible for a visit in 6 week(s).   Specialty: Obstetrics and Gynecology Contact information: [place] [address] [place], CNM, MSN 02/28/2022, 8:08 PM

## 2022-02-28 NOTE — Progress Notes (Addendum)
   PPD#1 S/P NSVD  Live born female  Birth Weight: 8 lb 6.4 oz (3810 g) APGAR: 8, 9  Newborn Delivery   Birth date/time: 02/27/2022 18:07:00 Delivery type: Vaginal, Spontaneous     Baby name: Vincenza Hews  Delivering provider: Olivia Mackie   Lacerations: 2nd degree   Feeding: breast and bottle  Pain control at delivery: Epidural   S:  Reports feeling well. No complaints.             Tolerating PO/No nausea or vomiting             Bleeding is light             Pain controlled with acetaminophen and ibuprofen (OTC)             Up ad lib/ambulatory/voiding without difficulties   O:  A & O x 3, in no apparent distress  Vitals:   02/27/22 2130 02/28/22 0130 02/28/22 0417 02/28/22 0810  BP: 120/75 106/73 105/66 103/69  Pulse: 86 88 68 76  Resp: 17 18 18    Temp: 98.7 F (37.1 C) 99 F (37.2 C) 98 F (36.7 C) 97.8 F (36.6 C)  TempSrc: Oral Oral Oral Oral  SpO2: 100% 99%  98%  Weight:      Height:       Recent Labs    02/27/22 2015 02/28/22 0556  WBC 16.0* 11.2*  HGB 11.7* 10.9*  HCT 35.1* 32.3*  PLT 135* 126*    Blood type: --/--/O NEG (05/22 1058)  Rubella: Immune (11/03 0000)   I&O: I/O last 3 completed shifts: In: -  Out: 750 [Urine:650; Blood:100]          No intake/output data recorded.  Gen: AAO x 3, NAD Abdomen: soft, non-tender, non-distended Fundus: firm, non-tender, U-1 Perineum: repair intact Lochia: small Extremities: no edema, no calf pain or tenderness             A/P:     PPD # 1 31 y.o., 07-02-1992   Principal Problem:   Postpartum care following vaginal delivery 5/22             Doing well - stable status             Routine post partum orders Active Problems:   Rh negative, maternal             Baby Rh positive             Rhogam prior to discharge   Encounter for induction of labor   SVD 5/22   Second degree perineal laceration             Discussed perineal care and comfort measures.    Anticipate discharge tomorrow.  6/22, MSN, CNM 02/28/2022, 10:59 AM

## 2022-02-28 NOTE — Lactation Note (Signed)
This note was copied from a baby's chart. Lactation Consultation Note  Patient Name: Molly Chase [id number] Date: 02/28/2022 Reason for consult: Follow-up assessment;Breastfeeding assistance;Nipple pain/trauma;Term Age:29 hours  LC entered the room and baby was asleep on dad. Per mom her nipples are very sore and she is taking a break from breastfeeding. LC did not assess mom's nipples because she had visitors in the room.  Mom states that latching hurts and she has tried flipping baby's lip and pulling her chin. Mom is fine with bottle feeding baby if need be. [place] spoke with mom about possibly pumping if she would like to continue to provide baby with breast milk. Mom states that she is not sure if she would like to continue with breastfeeding, but for now she will continue to hand express.   LC did let mom know that we have breast shells available to help with reducing the friction from her clothes on her nipples. Mom states that she is okay for now.   Mom says that the [facility] from last night spoke with her about baby possibly having a thick labial frenulum. [place] asked mom if she had spoken with the pediatrician about it. She states that she has not.   Mom says that she does not know if she would like to have baby's mouth evaluated because she doesn't know if she will continue with breastfeeding.  LC encouraged mom, praised her for her efforts, and exited the room.   Mom will call for assistance with breastfeeding if needed.   Feeding Mother's Current Feeding Choice: Breast Milk and Formula   Interventions Interventions: Education  Discharge    Consult Status Consult Status: Follow-up Date: 03/01/22 Follow-up type: In-patient    Lysbeth Penner 02/28/2022, 2:43 PM

## 2022-02-28 NOTE — Lactation Note (Signed)
This note was copied from a baby's chart. Lactation Consultation Note Baby had been BF for about 25 minutes. Mom states it hurts and her nipples are sore. They looked a little sore. Redness noted. Mom BF w/baby laying in her lap. LC placed foiled blanket under her head. Baby aggressive feeding. Mom wrenches. LC flanges lips. Mom stated it's because she all ready sore. Newborn feeding habits, STS, I&O, positioning, support discussed. Mom encouraged to feed baby 8-12 times/24 hours and with feeding cues.   Repositioned baby and demonstrated latch break. Baby still hurting when latched. Baby sleeping now. LC placed baby in bassinet. Encouraged mom to rest. Encouraged to call for assistance w/latch as needed.  Patient Name: Girl Darlis Wragg [id number] Date: 02/28/2022 Reason for consult: Initial assessment;Term;Maternal endocrine disorder Age:61 hours  Maternal Data Does the patient have breastfeeding experience prior to this delivery?: Yes How long did the patient breastfeed?: 2 months  Feeding    LATCH Score Latch: Grasps breast easily, tongue down, lips flanged, rhythmical sucking.  Audible Swallowing: None  Type of Nipple: Everted at rest and after stimulation  Comfort (Breast/Nipple): Filling, red/small blisters or bruises, mild/mod discomfort (sore)  Hold (Positioning): Assistance needed to correctly position infant at breast and maintain latch.  LATCH Score: 6   Lactation Tools Discussed/Used Tools: Coconut oil  Interventions Interventions: Breast feeding basics reviewed;Assisted with latch;Skin to skin;Coconut oil;LC Services brochure  Discharge    Consult Status Consult Status: Follow-up Date: 02/28/22 Follow-up type: In-patient    Charyl Dancer 02/28/2022, 4:50 AM

## 2022-02-28 NOTE — Progress Notes (Signed)
MOB was referred for history of anxiety. * Referral screened out by Clinical Social Worker because none of the following criteria appear to apply: ~ History of anxiety during this pregnancy, or of post-partum depression following prior delivery. No concerns of anxiety noted in prenatal records.  ~ Diagnosis of anxiety within last 3 years. Per chart review, MOB's anxiety dates back to 2016.  OR * MOB's symptoms currently being treated with medication and/or therapy.  Please contact the Clinical Social Worker if needs arise, by MOB request, or if MOB scores greater than 9/yes to question 10 on Edinburgh Postpartum Depression Screen.  Fadumo Heng, LCSW Clinical Social Worker [facility] Cell#: [phone] 

## 2022-02-28 NOTE — Anesthesia Postprocedure Evaluation (Signed)
Anesthesia Post Note  Patient: Molly Chase  Procedure(s) Performed: AN AD HOC LABOR EPIDURAL     Patient location during evaluation: Mother Baby Anesthesia Type: Epidural Level of consciousness: awake and alert and oriented Pain management: satisfactory to patient Vital Signs Assessment: post-procedure vital signs reviewed and stable Respiratory status: respiratory function stable Cardiovascular status: stable Postop Assessment: no headache, no backache, epidural receding, patient able to bend at knees, no signs of nausea or vomiting, adequate PO intake and able to ambulate Anesthetic complications: no   No notable events documented.  Last Vitals:  Vitals:   02/28/22 0417 02/28/22 0810  BP: 105/66 103/69  Pulse: 68 76  Resp: 18   Temp: 36.7 C 36.6 C  SpO2:  98%    Last Pain:  Vitals:   02/28/22 0810  TempSrc: Oral  PainSc:    Pain Goal:                   Allisha Harter

## 2022-03-01 LAB — RH IG WORKUP (INCLUDES ABO/RH)
Fetal Screen: NEGATIVE
Gestational Age(Wks): 39.2
Unit division: 0

## 2022-03-07 ENCOUNTER — Telehealth (HOSPITAL_COMMUNITY): Payer: Self-pay | Admitting: *Deleted

## 2022-03-07 NOTE — Telephone Encounter (Signed)
Attempted hospital discharge follow-up call. Left message for patient to return RN call. Erline Levine, RN, 03/07/22, [phone]

## 2022-03-13 ENCOUNTER — Emergency Department (INDEPENDENT_AMBULATORY_CARE_PROVIDER_SITE_OTHER): Payer: Commercial Managed Care - PPO

## 2022-03-13 ENCOUNTER — Emergency Department (INDEPENDENT_AMBULATORY_CARE_PROVIDER_SITE_OTHER)
Admission: RE | Admit: 2022-03-13 | Discharge: 2022-03-13 | Disposition: A | Discharge status: Home / Self Care | Payer: Commercial Managed Care - PPO | Source: Ambulatory Visit

## 2022-03-13 ENCOUNTER — Encounter: Payer: Self-pay | Admitting: Medical-Surgical

## 2022-03-13 VITALS — BP 120/79 | HR 88 | Temp 99.6°F | Resp 16 | Ht 63.0 in | Wt 153.0 lb

## 2022-03-13 DIAGNOSIS — M79605 Pain in left leg: Secondary | ICD-10-CM | POA: Diagnosis not present

## 2022-03-13 DIAGNOSIS — M79652 Pain in left thigh: Secondary | ICD-10-CM

## 2022-03-13 DIAGNOSIS — Z780 Asymptomatic menopausal state: Secondary | ICD-10-CM | POA: Diagnosis not present

## 2022-03-13 MED ORDER — DICLOFENAC SODIUM 25 MG PO TBEC: 25.0000 mg | DELAYED_RELEASE_TABLET | Freq: Two times a day (BID) | ORAL | 0 refills | Status: AC | PRN

## 2022-03-13 MED ORDER — TIZANIDINE HCL 2 MG PO TABS: 2.0000 mg | ORAL_TABLET | Freq: Two times a day (BID) | ORAL | 0 refills | Status: AC | PRN

## 2022-03-13 NOTE — ED Provider Notes (Signed)
Molly Chase    CSN: [id number] Arrival date & time: 03/13/22  1036      History   Chief Complaint Chief Complaint  Patient presents with   Leg Pain    Right thigh    HPI Molly Chase is a 31 y.o. female.   HPI Patient, 14 days post-partum, presents with upper right upper thigh and right groin pain x 2 days. She reports pain has worsened and pain with ambulation. She has measured her leg and denies any increase in leg circumference. She denies redness or bruising. She is concerned for possible DVT as she reports being immobile more than normal and she has a family history of Factor V clotting disorder for which she had a negative work-up prior to getting pregnant.  Past Medical History:  Diagnosis Date   H pylori ulcer    Ovarian cyst    PCOS (polycystic ovarian syndrome)     Patient Active Problem List   Diagnosis Date Noted   SVD 5/22 02/28/2022   Postpartum care following vaginal delivery 5/22 02/28/2022   Second degree perineal laceration 02/28/2022   Encounter for induction of labor 02/27/2022   Rh negative, maternal 07/08/2018    Past Surgical History:  Procedure Laterality Date   NO PAST SURGERIES     UPPER GI ENDOSCOPY      OB History     Gravida  2   Para  2   Term  2   Preterm      AB      Living  2      SAB      IAB      Ectopic      Multiple  0   Live Births  2            Home Medications    Prior to Admission medications   Medication Sig Start Date End Date Taking? Authorizing Provider  diclofenac (VOLTAREN) 25 MG EC tablet Take 1 tablet (25 mg total) by mouth 2 (two) times daily as needed. 03/13/22  Yes Scot Jun, FNP  tiZANidine (ZANAFLEX) 2 MG tablet Take 1 tablet (2 mg total) by mouth 2 (two) times daily as needed for muscle spasms (leg pain). 03/13/22  Yes Scot Jun, FNP  ibuprofen (ADVIL) 600 MG tablet Take 1 tablet (600 mg total) by mouth every 6 (six) hours. Patient not taking: Reported on  03/13/2022 03/01/22   Charyl Bigger, MD  Prenatal Vit-Fe Fumarate-FA (MULTIVITAMIN-PRENATAL) 27-0.8 MG TABS tablet Take 1 tablet by mouth daily at 12 noon.    [provider]    Family History Family History  Problem Relation Age of Onset   Healthy Mother    Healthy Father    High blood pressure Father    Heart disease Maternal Grandmother    Hypertension Maternal Grandmother    Diabetes Maternal Grandmother    Heart attack Maternal Grandmother    High blood pressure Maternal Grandmother    Heart attack Maternal Grandfather    Hypertension Maternal Grandfather    Diabetes Maternal Grandfather    Stroke Paternal Grandfather    Heart disease Paternal Grandfather    Pulmonary embolism Paternal Grandfather    Skin cancer Paternal Grandfather    Heart attack Paternal Grandfather    Parkinson's disease Paternal Grandfather     Social History Social History   Tobacco Use   Smoking status: Never   Smokeless tobacco: Never  Vaping Use   Vaping Use: Never  used  Substance Use Topics   Alcohol use: Not Currently    Alcohol/week: 4.0 standard drinks    Types: 4 Standard drinks or equivalent per week   Drug use: No     Allergies   Patient has no known allergies.   Review of Systems Review of Systems Pertinent negatives listed in HPI  Physical Exam Triage Vital Signs ED Triage Vitals  Enc Vitals Group     BP 03/13/22 1110 120/79     Pulse Rate 03/13/22 1110 88     Resp 03/13/22 1110 16     Temp 03/13/22 1110 99.6 F (37.6 C)     Temp Source 03/13/22 1110 Oral     SpO2 03/13/22 1110 98 %     Weight 03/13/22 1111 153 lb (69.4 kg)     Height 03/13/22 1111 5\' 3"  (1.6 m)     Head Circumference --      Peak Flow --      Pain Score 03/13/22 1111 2     Pain Loc --      Pain Edu? --      Excl. in [place]? --    No data found.  Updated Vital Signs BP 120/79 (BP Location: Left Arm)   Pulse 88   Temp 99.6 F (37.6 C) (Oral)   Resp 16   Ht 5\' 3"  (1.6 m)   Wt 153  lb (69.4 kg)   SpO2 98%   Breastfeeding No   BMI 27.10 kg/m   Visual Acuity Right Eye Distance:   Left Eye Distance:   Bilateral Distance:    Right Eye Near:   Left Eye Near:    Bilateral Near:     Physical Exam Constitutional:      Appearance: Normal appearance.  HENT:     Head: Normocephalic and atraumatic.  Eyes:     Extraocular Movements: Extraocular movements intact.     Pupils: Pupils are equal, round, and reactive to light.  Cardiovascular:     Rate and Rhythm: Normal rate and regular rhythm.  Pulmonary:     Effort: Pulmonary effort is normal.     Breath sounds: Normal breath sounds.  Musculoskeletal:     Cervical back: Normal range of motion and neck supple.     Left upper leg: Tenderness present. No swelling, edema or deformity.  Neurological:     General: No focal deficit present.     Mental Status: She is alert and oriented to person, place, and time.  Psychiatric:        Mood and Affect: Mood normal.        Behavior: Behavior normal.   UC Treatments / Results  Labs (all labs ordered are listed, but only abnormal results are displayed) Labs Reviewed - No data to display  EKG   Radiology US Venous Img Lower Unilateral Left  Result Date: 03/13/2022 CLINICAL DATA:  Pain left leg, postpartum status EXAM: Left LOWER EXTREMITY VENOUS DOPPLER ULTRASOUND TECHNIQUE: Gray-scale sonography with compression, as well as color and duplex ultrasound, were performed to evaluate the deep venous system(s) from the level of the common femoral vein through the popliteal and proximal calf veins. COMPARISON:  None Available. FINDINGS: VENOUS Normal compressibility of the common femoral, superficial femoral, and popliteal veins, as well as the visualized calf veins. Peroneal veins are not adequately visualized for evaluation. Visualized portions of profunda femoral vein and great saphenous vein unremarkable. No filling defects to suggest DVT on grayscale or color Doppler imaging.  Doppler  waveforms show normal direction of venous flow, normal respiratory plasticity and response to augmentation. Limited views of the contralateral common femoral vein are unremarkable. OTHER None. Limitations: none IMPRESSION: There is no evidence of acute DVT in the major deep veins in the left lower extremity. Peroneal veins in the calf are not visualized for evaluation. If there are persistent symptoms follow-up examination in 24 hours may be considered. Electronically Signed   By: Elmer Picker M.D.   On: 03/13/2022 12:42    Procedures Procedures (including critical care time)  Medications Ordered in UC Medications - No data to display  Initial Impression / Assessment and Plan / UC Course  I have reviewed the triage vital signs and the nursing notes.  Pertinent labs & imaging results that were available during my care of the patient were reviewed by me and considered in my medical decision making (see chart for details).    Acute pain of left thigh DVT study negative. Treating for muscle etiology per discharge medications.  Patient is not breastfeeding. Strict return precautions given  Final Clinical Impressions(s) / UC Diagnoses   Final diagnoses:  Acute pain of left thigh     Discharge Instructions      Your DVT study is negative for blood clot. Suspect leg pain is more muscular in nature       ED Prescriptions     Medication Sig Dispense Auth. Provider   tiZANidine (ZANAFLEX) 2 MG tablet Take 1 tablet (2 mg total) by mouth 2 (two) times daily as needed for muscle spasms (leg pain). 20 tablet Scot Jun, FNP   diclofenac (VOLTAREN) 25 MG EC tablet Take 1 tablet (25 mg total) by mouth 2 (two) times daily as needed. 20 tablet Scot Jun, FNP      PDMP not reviewed this encounter.   Scot Jun, FNP 03/14/22 1944

## 2022-03-13 NOTE — Discharge Instructions (Addendum)
Your DVT study is negative for blood clot. Suspect leg pain is more muscular in nature

## 2022-03-13 NOTE — ED Triage Notes (Signed)
Right thigh pain - cramping  Started 2 days ago Pt denies warm or  redness  at site Pt has been measuring leg circumference - no changes  Post partum 14 days

## 2022-07-14 IMAGING — US US MFM OB LIMITED
1 series · 15 of 25 positions shown · non-contrast
Comparison: none

[Series 1: us mfm ob limited · 25 acquisitions, 15 frames shown]
[im 1/25]
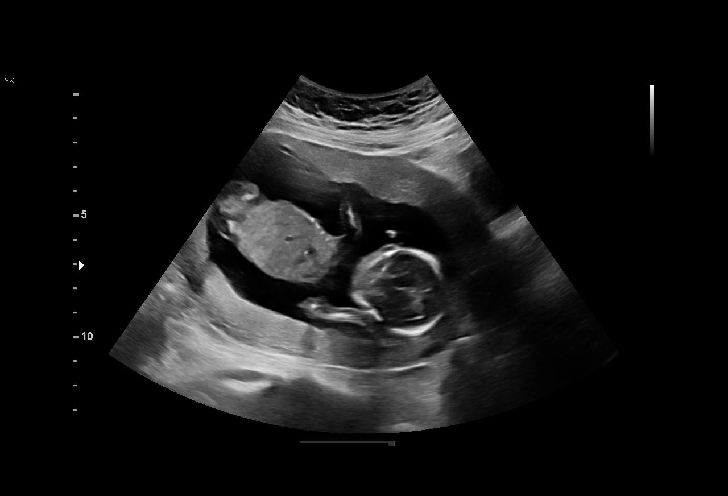
[im 3/25]
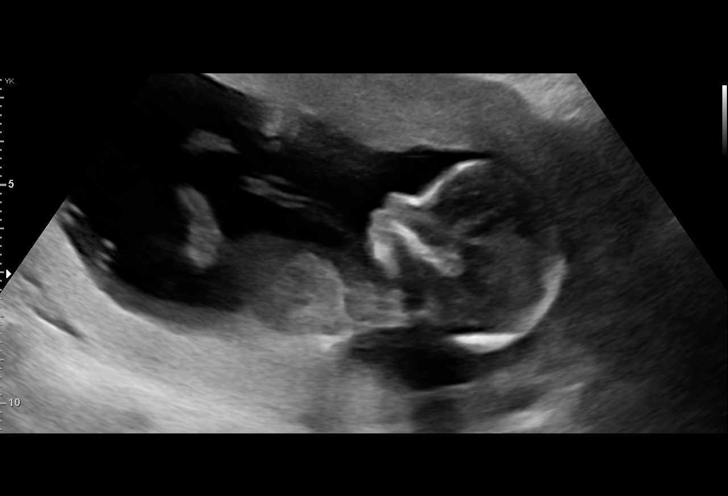
[im 5/25]
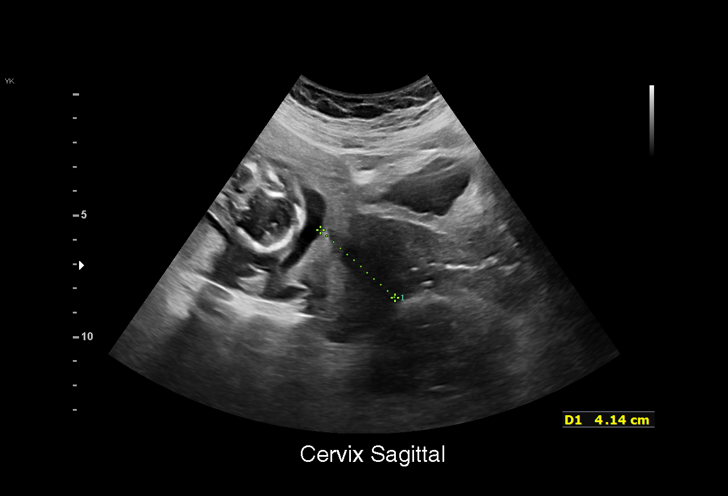
[im 6/25]
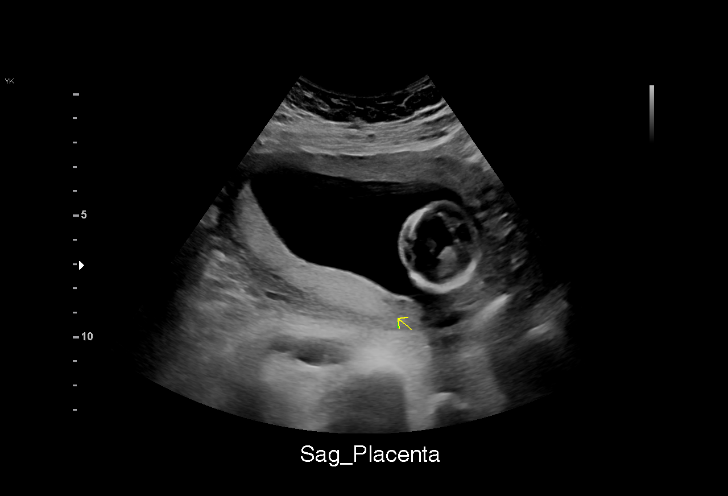
[im 8/25]
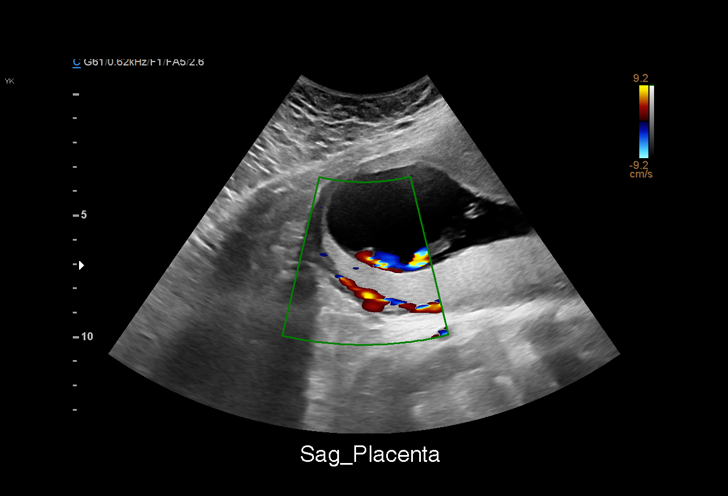
[im 10/25]
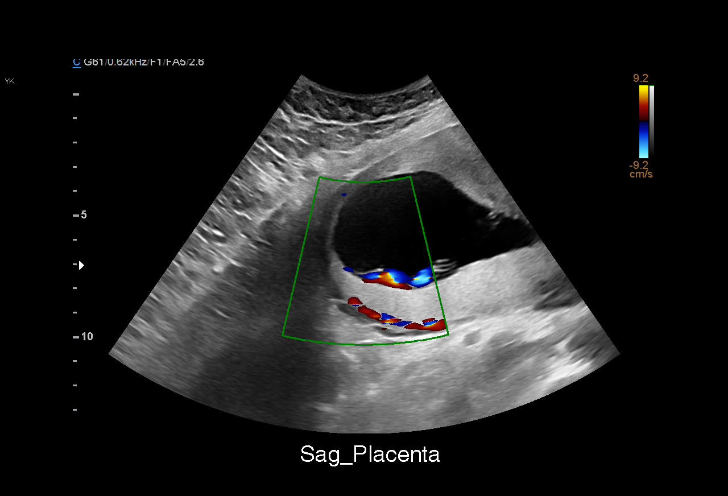
[im 11/25]
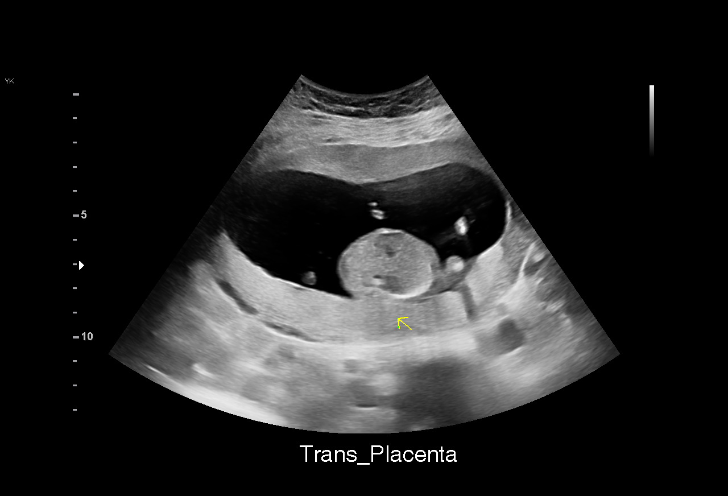
[im 13/25]
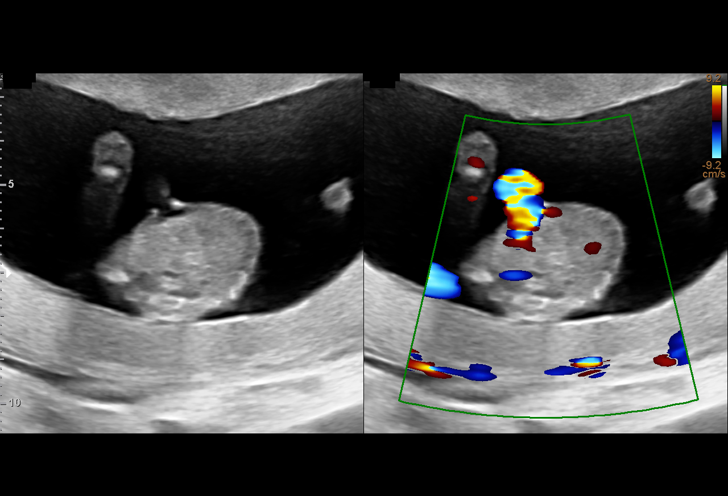
[im 15/25]
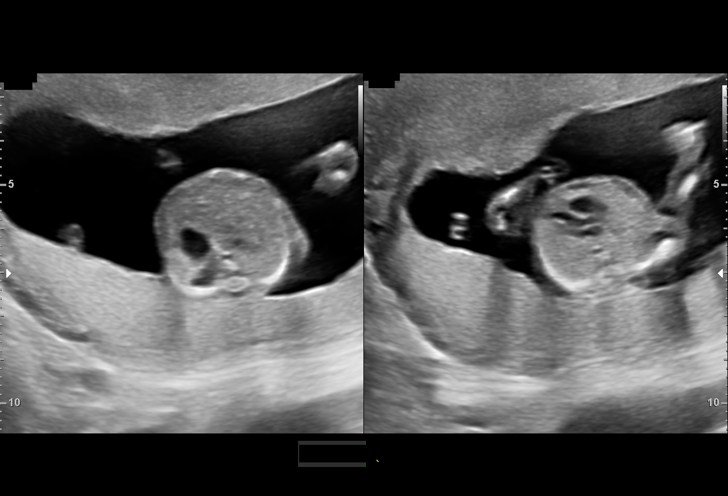
[im 16/25]
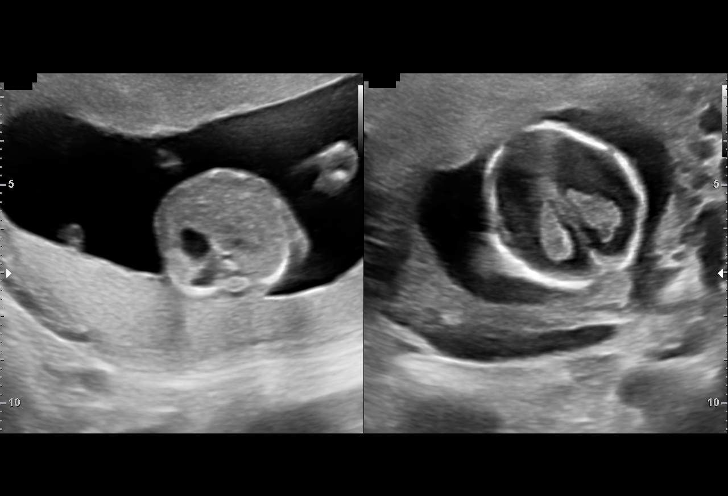
[im 18/25]
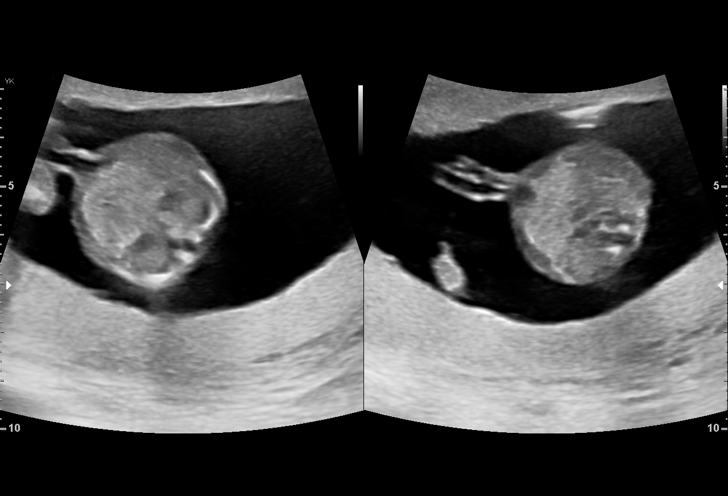
[im 20/25]
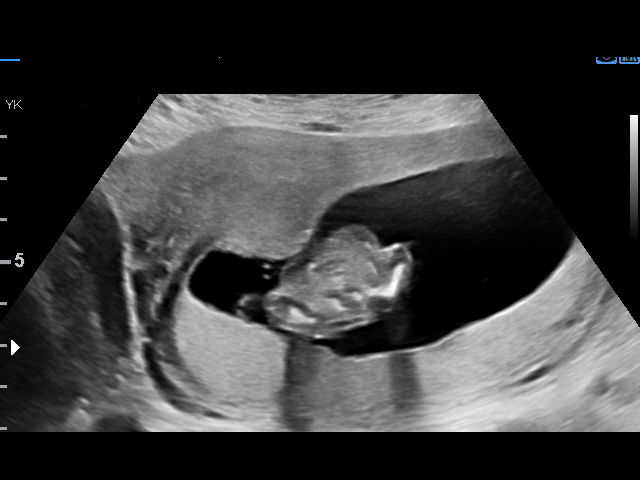
[im 21/25]
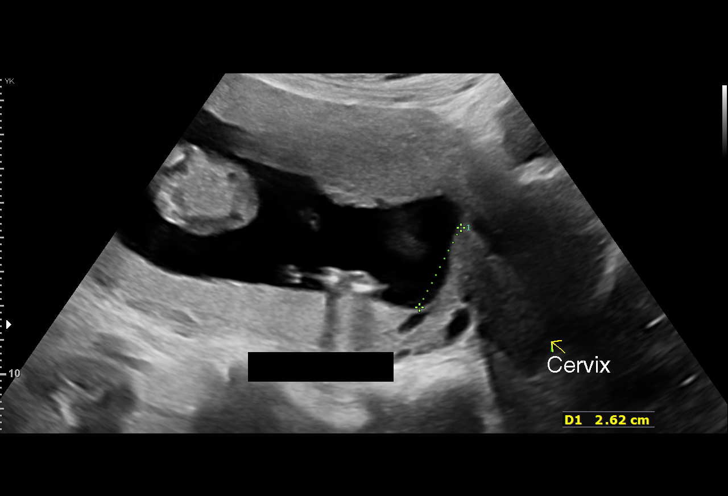
[im 23/25]
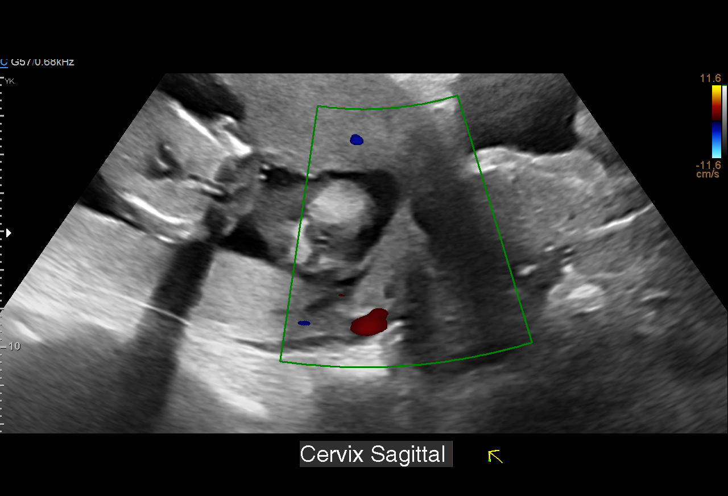
[im 25/25]
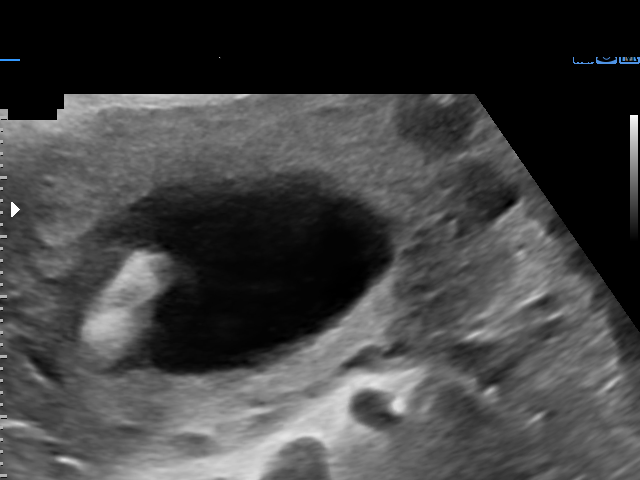

[15 of 25 positions shown; findings below may reference images not displayed]

XUJE NP                                  [HOSPITAL]

 1  US MFM OB LIMITED                     76815.01    BANSIGAYEKO ZIZINA

Indications

 Vaginal bleeding in pregnancy, second
 trimester
 Abdominal pain in pregnancy
 15 weeks gestation of pregnancy
Fetal Evaluation

 Num Of Fetuses:          1
 Fetal Heart Rate(bpm):   145
 Cardiac Activity:        Observed
 Presentation:            Cephalic
 Placenta:                Posterior
 P. Cord Insertion:       Marginal insertion

 Amniotic Fluid
 AFI FV:      Within normal limits

                             Largest Pocket(cm)

OB History

 Gravidity:    2
 Living:       1
Gestational Age

 LMP:           15w 1d        Date:  05/28/21                 EDD:   03/04/22
 Best:          15w 1d     Det. By:  LMP  (05/28/21)          EDD:   03/04/22
Anatomy
 Cranium:               Appears normal         Abdominal Wall:         Appears nml (cord
                                                                       insert, abd wall)
 Choroid Plexus:        Appears normal         Cord Vessels:           Appears normal (3
                                                                       vessel cord)
 Face:                  Profile appears        Kidneys:                Appear normal
                        normal
 Stomach:               Appears normal, left   Bladder:                Appears normal
                        sided
Cervix Uterus Adnexa

 Cervix
 Length:           3.66  cm.
 Normal appearance by transabdominal scan.

 Uterus
 No abnormality visualized.

 Right Ovary
 No adnexal mass visualized.

 Left Ovary
 No adnexal mass visualized.

 Cul De Sac
 No free fluid seen.

 Adnexa
 No adnexal mass visualized.
Comments

 This patient presented to the [facility] due to vaginal bleeding.

 A limited ultrasound performed today shows that the fetus is
 in the vertex presentation.

 There was normal amniotic fluid noted.

 A normal appearing posterior placenta is noted.

## 2023-01-13 IMAGING — US US EXTREM LOW VENOUS*L*
1 series · 13 of 24 positions shown · non-contrast
Comparison: None Available.

CLINICAL DATA: Pain left leg, postpartum status

EXAM:
Left LOWER EXTREMITY VENOUS DOPPLER ULTRASOUND
TECHNIQUE: Gray-scale sonography with compression, as well as color and duplex
ultrasound, were performed to evaluate the deep venous system(s)
from the level of the common femoral vein through the popliteal and
proximal calf veins.

[Series 1: us venous img lower uni left (dvt) · portal-venous · 13 of 39 slices shown]
[im 1/39]
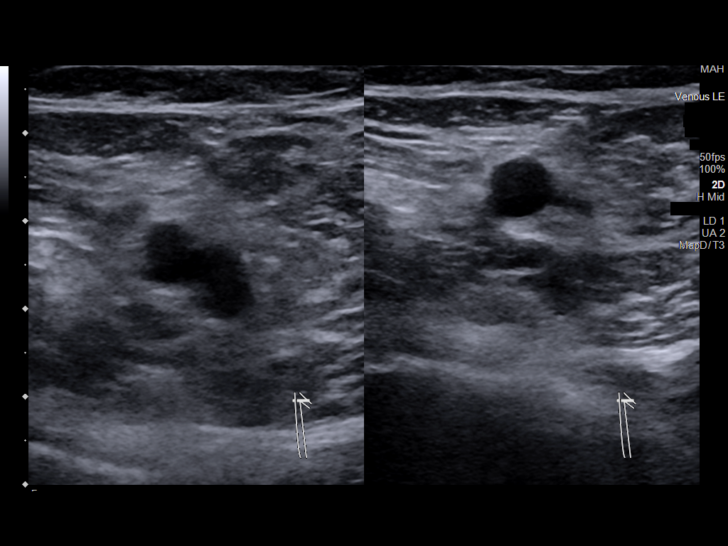
[im 4/39]
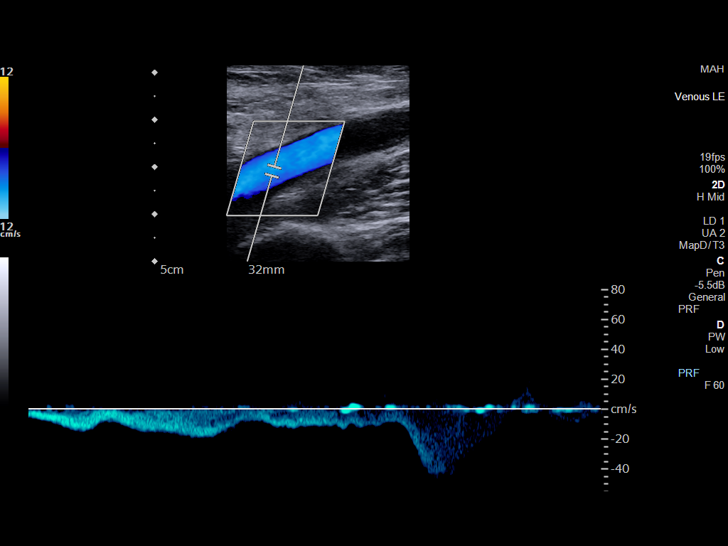
[im 7/39]
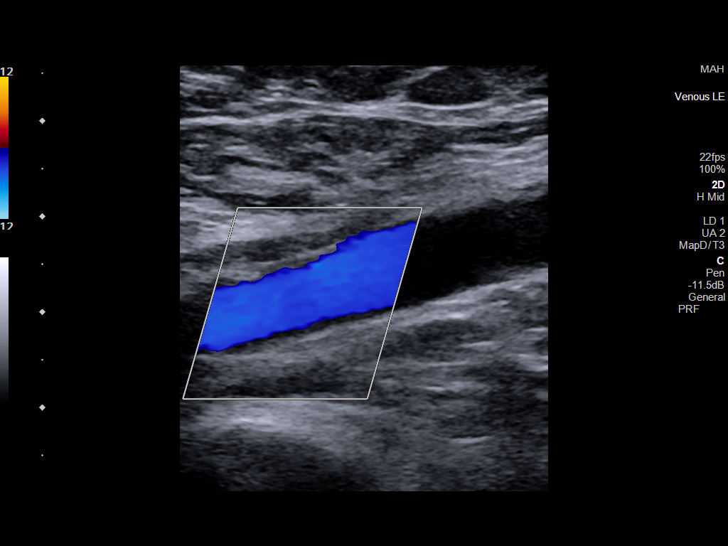
[im 10/39]
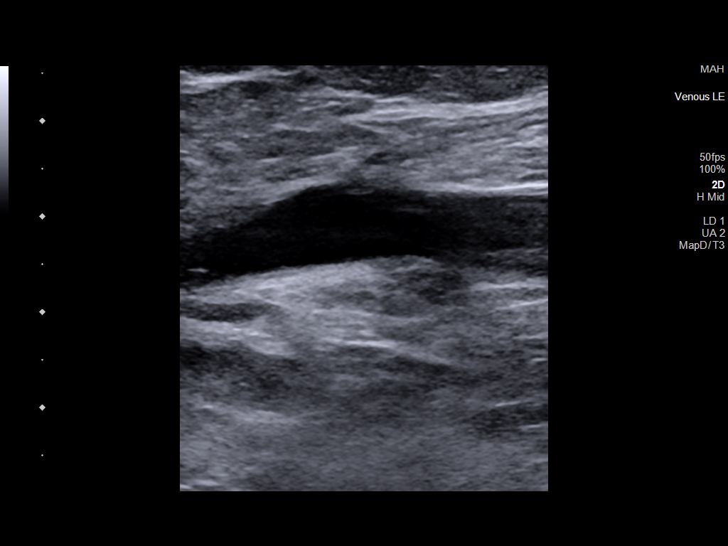
[im 14/39]
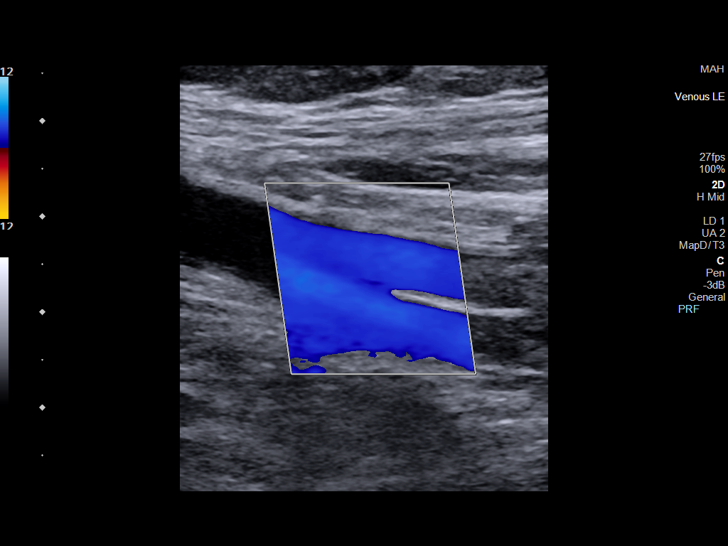
[im 17/39]
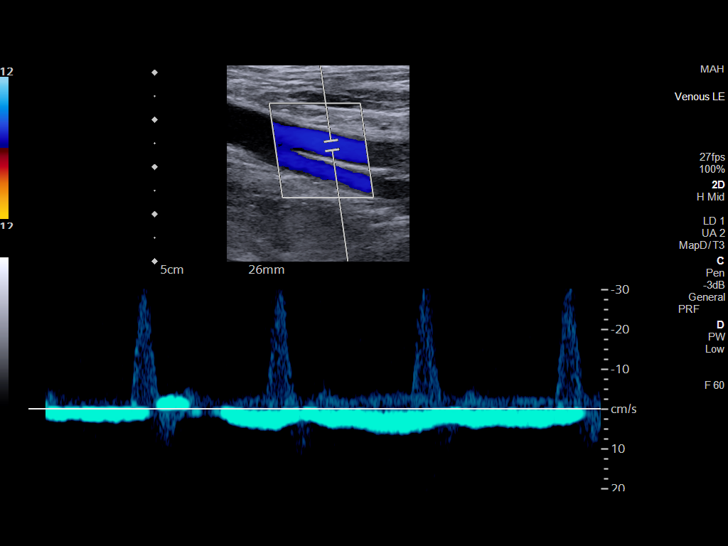
[im 20/39]
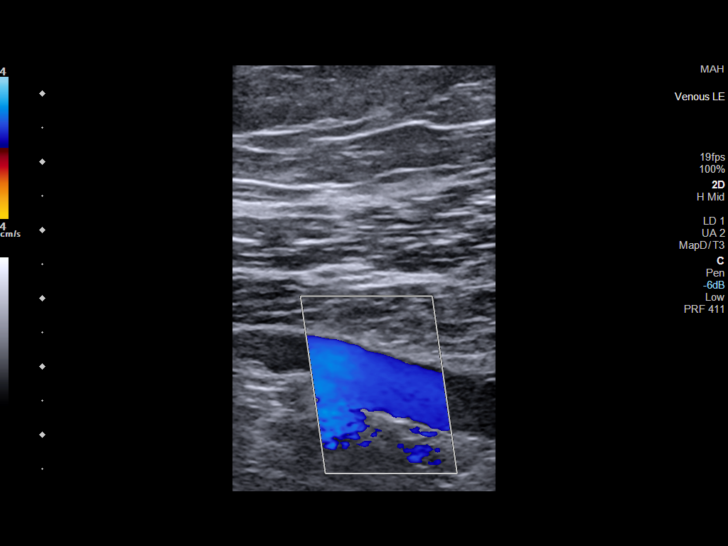
[im 22/39]
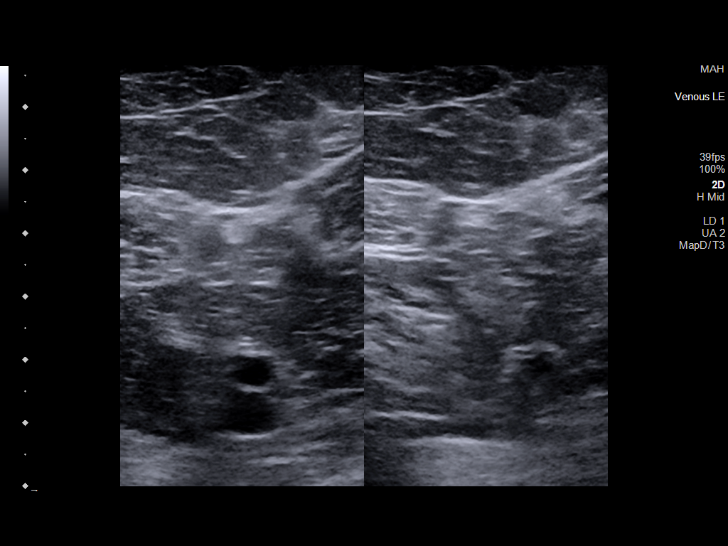
[im 25/39]
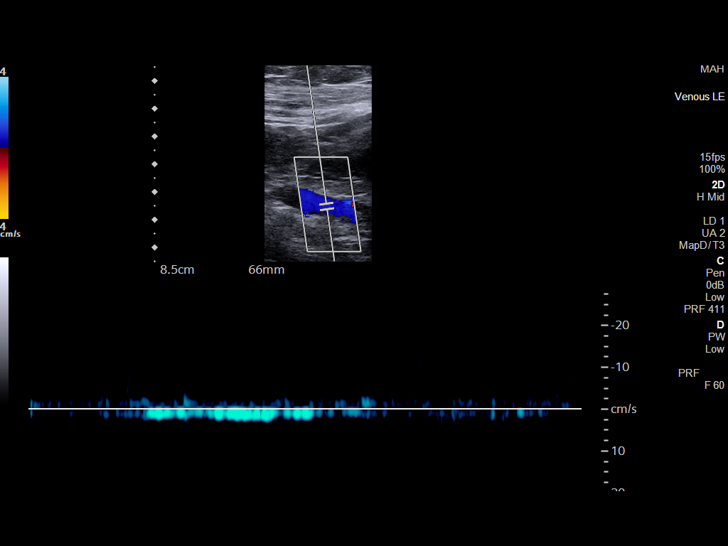
[im 29/39]
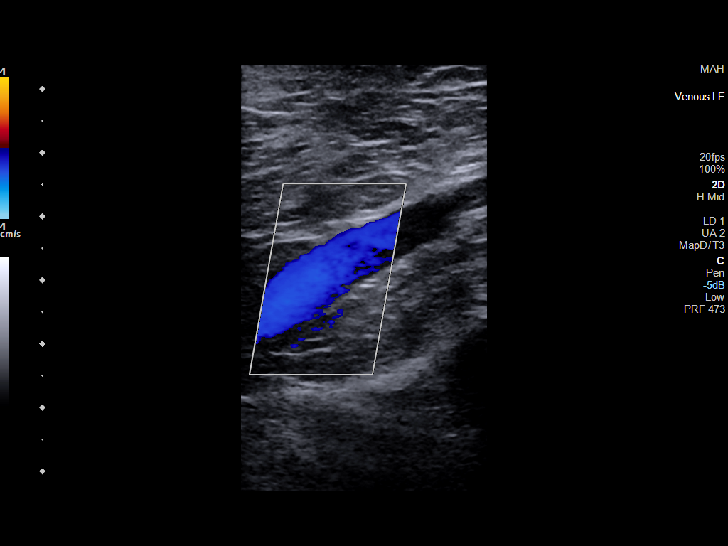
[im 32/39]
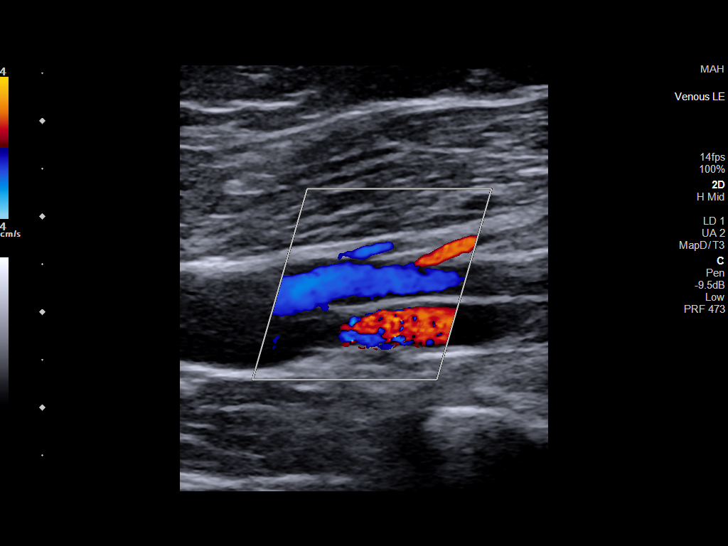
[im 35/39]
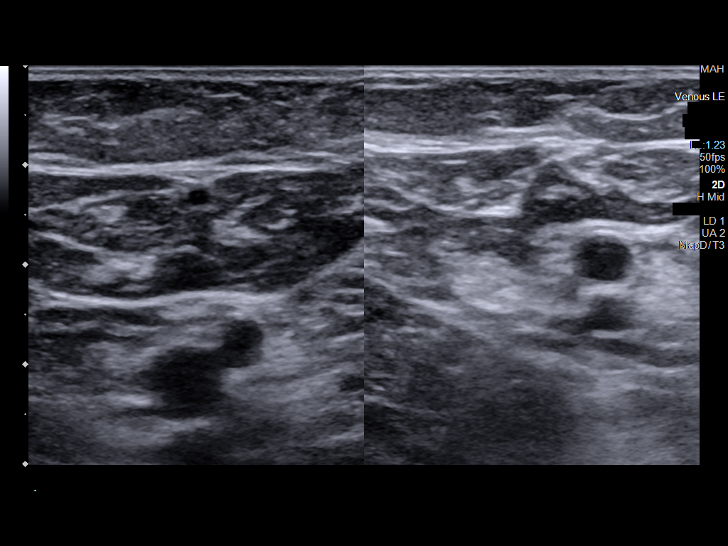
[im 39/39]
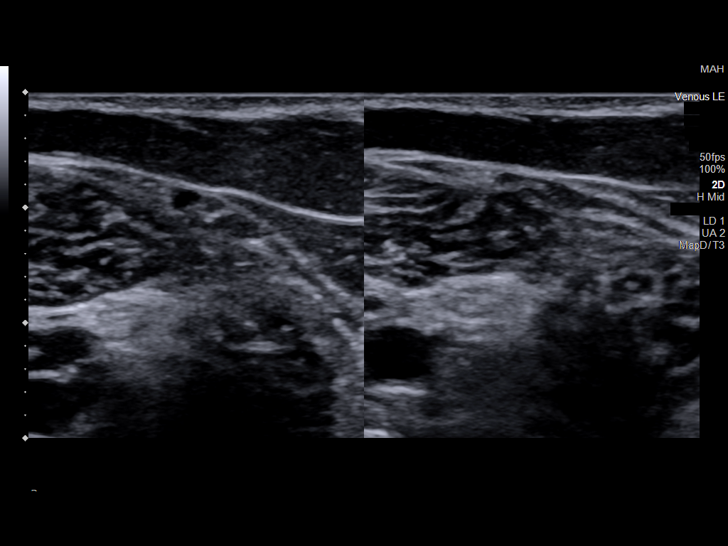

[13 of 24 positions shown; findings below may reference images not displayed]

FINDINGS: VENOUS

Normal compressibility of the common femoral, superficial femoral,
and popliteal veins, as well as the visualized calf veins. Peroneal
veins are not adequately visualized for evaluation. Visualized
portions of profunda femoral vein and great saphenous vein
unremarkable. No filling defects to suggest DVT on grayscale or
color Doppler imaging. Doppler waveforms show normal direction of
venous flow, normal respiratory plasticity and response to
augmentation.

Limited views of the contralateral common femoral vein are
unremarkable.

OTHER

None.

Limitations: none
IMPRESSION: There is no evidence of acute DVT in the major deep veins in the
left lower extremity. Peroneal veins in the calf are not visualized
for evaluation. If there are persistent symptoms follow-up
examination in 24 hours may be considered.
